# Patient Record
Sex: Male | Born: 1960
Health system: Southern US, Community
[De-identification: ages and names within clinical notes are randomized; demographics above are authoritative.]

## PROBLEM LIST (undated history)

## (undated) DIAGNOSIS — I10 Essential (primary) hypertension: Secondary | ICD-10-CM

## (undated) DIAGNOSIS — N529 Male erectile dysfunction, unspecified: Secondary | ICD-10-CM

## (undated) DIAGNOSIS — M1990 Unspecified osteoarthritis, unspecified site: Secondary | ICD-10-CM

## (undated) HISTORY — DX: Essential (primary) hypertension: I10

## (undated) HISTORY — DX: Male erectile dysfunction, unspecified: N52.9

---

## 2006-01-29 ENCOUNTER — Ambulatory Visit: Payer: Self-pay | Admitting: Unknown Physician Specialty

## 2009-04-27 HISTORY — PX: COLONOSCOPY: SHX174

## 2014-10-31 ENCOUNTER — Ambulatory Visit: Payer: Self-pay | Admitting: Family Medicine

## 2014-11-01 ENCOUNTER — Ambulatory Visit (INDEPENDENT_AMBULATORY_CARE_PROVIDER_SITE_OTHER): Payer: BLUE CROSS/BLUE SHIELD | Admitting: Family Medicine

## 2014-11-01 ENCOUNTER — Encounter: Payer: Self-pay | Admitting: Family Medicine

## 2014-11-01 VITALS — BP 120/90 | HR 60 | Ht 69.0 in | Wt 225.0 lb

## 2014-11-01 DIAGNOSIS — N529 Male erectile dysfunction, unspecified: Secondary | ICD-10-CM | POA: Diagnosis not present

## 2014-11-01 DIAGNOSIS — Z008 Encounter for other general examination: Secondary | ICD-10-CM

## 2014-11-01 DIAGNOSIS — I10 Essential (primary) hypertension: Secondary | ICD-10-CM | POA: Diagnosis not present

## 2014-11-01 DIAGNOSIS — E785 Hyperlipidemia, unspecified: Secondary | ICD-10-CM | POA: Diagnosis not present

## 2014-11-01 LAB — HEMOCCULT GUIAC POC 1CARD (OFFICE): FECAL OCCULT BLD: NEGATIVE

## 2014-11-01 MED ORDER — OMEGA-3-ACID ETHYL ESTERS 1 G PO CAPS: 1.0000 g | ORAL_CAPSULE | Freq: Every day | ORAL | Status: DC

## 2014-11-01 MED ORDER — QUINAPRIL HCL 20 MG PO TABS: 20.0000 mg | ORAL_TABLET | Freq: Every day | ORAL | Status: DC

## 2014-11-01 MED ORDER — SILDENAFIL CITRATE 100 MG PO TABS: 50.0000 mg | ORAL_TABLET | Freq: Every day | ORAL | Status: DC | PRN

## 2014-11-01 NOTE — Progress Notes (Signed)
Name: Ryan Parks   MRN: [id number]    DOB: 12/19/1960   Date:11/01/2014       Progress Note  Subjective  Chief Complaint  Chief Complaint  Patient presents with  . Hypertension    Hypertension This is a chronic problem. The current episode started more than 1 year ago. The problem is controlled. Pertinent negatives include no anxiety, blurred vision, chest pain, headaches, malaise/fatigue, neck pain, orthopnea, palpitations, peripheral edema, PND, shortness of breath or sweats. There are no known risk factors for coronary artery disease. Past treatments include ACE inhibitors. The current treatment provides moderate improvement. There is no history of angina, kidney disease, CAD/MI, CVA, heart failure, left ventricular hypertrophy, PVD or retinopathy. There is no history of chronic renal disease.  Male GU Problem The patient's pertinent negatives include no genital injury, genital itching, pelvic pain, penile discharge, penile pain, priapism, scrotal swelling or testicular pain. This is a recurrent problem. The current episode started more than 1 year ago. The problem occurs intermittently. The problem has been unchanged. Pertinent negatives include no abdominal pain, anorexia, chest pain, chills, constipation, coughing, diarrhea, discolored urine, dysuria, fever, flank pain, frequency, headaches, hematuria, hesitancy, joint pain, joint swelling, nausea, painful intercourse, rash, shortness of breath, sore throat, urgency, urinary retention or vomiting. Nothing aggravates the symptoms.    No problem-specific assessment & plan notes found for this encounter.   Past Medical History  Diagnosis Date  . Hypertension     Past Surgical History  Procedure Laterality Date  . Colonoscopy  2011    [facility]- polyps- repeat in 7    History reviewed. No pertinent family history.  History   Social History  . Marital Status: Married    Spouse Name: N/A  . Number of Children: N/A  .  Years of Education: N/A   Occupational History  . Not on file.   Social History Main Topics  . Smoking status: Never Smoker   . Smokeless tobacco: Not on file  . Alcohol Use: 0.0 oz/week    0 Standard drinks or equivalent per week  . Drug Use: No  . Sexual Activity: Yes   Other Topics Concern  . Not on file   Social History Narrative  . No narrative on file    No Known Allergies   Review of Systems  Constitutional: Negative for fever, chills, weight loss and malaise/fatigue.  HENT: Negative for ear discharge, ear pain and sore throat.   Eyes: Negative for blurred vision.  Respiratory: Negative for cough, sputum production, shortness of breath and wheezing.   Cardiovascular: Negative for chest pain, palpitations, orthopnea, leg swelling and PND.  Gastrointestinal: Negative for heartburn, nausea, vomiting, abdominal pain, diarrhea, constipation, blood in stool, melena and anorexia.  Genitourinary: Negative for dysuria, hesitancy, urgency, frequency, hematuria, flank pain, discharge, scrotal swelling, penile pain, testicular pain and pelvic pain.  Musculoskeletal: Negative for myalgias, back pain, joint pain and neck pain.  Skin: Negative for rash.  Neurological: Negative for dizziness, tingling, sensory change, focal weakness and headaches.  Endo/Heme/Allergies: Negative for environmental allergies and polydipsia. Does not bruise/bleed easily.  Psychiatric/Behavioral: Negative for depression and suicidal ideas. The patient is not nervous/anxious and does not have insomnia.      Objective  Filed Vitals:   11/01/14 0910  BP: 120/90  Pulse: 60  Height: 5\' 9"  (1.753 m)  Weight: 225 lb (102.059 kg)    Physical Exam  Constitutional: He is oriented to person, place, and time and well-developed, well-nourished, and in  no distress.  HENT:  Head: Normocephalic.  Right Ear: External ear normal.  Left Ear: External ear normal.  Nose: Nose normal.  Mouth/Throat: Oropharynx is  clear and moist.  Eyes: Conjunctivae and EOM are normal. Pupils are equal, round, and reactive to light. Right eye exhibits no discharge. Left eye exhibits no discharge. No scleral icterus.  Neck: Normal range of motion. Neck supple. No JVD present. No tracheal deviation present. No thyromegaly present.  Cardiovascular: Normal rate, regular rhythm, normal heart sounds and intact distal pulses.  Exam reveals no gallop and no friction rub.   No murmur heard. Pulmonary/Chest: Breath sounds normal. No respiratory distress. He has no wheezes. He has no rales.  Abdominal: Soft. Bowel sounds are normal. He exhibits no mass. There is no hepatosplenomegaly. There is no tenderness. There is no rebound, no guarding and no CVA tenderness.  Genitourinary: Rectum normal, prostate normal and penis normal. Guaiac negative stool. No discharge found.  Musculoskeletal: Normal range of motion. He exhibits no edema or tenderness.  Lymphadenopathy:    He has no cervical adenopathy.  Neurological: He is alert and oriented to person, place, and time. He has normal sensation, normal strength, normal reflexes and intact cranial nerves. No cranial nerve deficit.  Skin: Skin is warm. No rash noted.  Psychiatric: Mood and affect normal.      Assessment & Plan  Problem List Items Addressed This Visit      Cardiovascular and Mediastinum   Hypertension - Primary   Relevant Medications   quinapril (ACCUPRIL) 20 MG tablet   omega-3 acid ethyl esters (LOVAZA) 1 G capsule   sildenafil (VIAGRA) 100 MG tablet   Other Relevant Orders   Renal Function Panel   POCT Occult Blood Stool   POCT Urinalysis Dipstick    Other Visit Diagnoses    Hyperlipidemia        Relevant Medications    quinapril (ACCUPRIL) 20 MG tablet    omega-3 acid ethyl esters (LOVAZA) 1 G capsule    sildenafil (VIAGRA) 100 MG tablet    Other Relevant Orders    Lipid Profile    Erectile dysfunction, unspecified erectile dysfunction type         Relevant Medications    sildenafil (VIAGRA) 100 MG tablet    Other Relevant Orders    Lipid Profile    PSA    Rectal exam        Relevant Orders    Ambulatory referral to Gastroenterology         Dr. Otilio Miu [facility]  11/01/2014

## 2014-11-02 LAB — RENAL FUNCTION PANEL
Albumin: 5 g/dL (ref 3.5–5.5)
BUN/Creatinine Ratio: 10 (ref 9–20)
BUN: 9 mg/dL (ref 6–24)
CALCIUM: 9.5 mg/dL (ref 8.7–10.2)
CO2: 28 mmol/L (ref 18–29)
CREATININE: 0.88 mg/dL (ref 0.76–1.27)
Chloride: 96 mmol/L — ABNORMAL LOW (ref 97–108)
GFR calc Af Amer: 112 mL/min/{1.73_m2} (ref >59)
GFR calc non Af Amer: 97 mL/min/{1.73_m2} (ref >59)
GLUCOSE: 77 mg/dL (ref 65–99)
PHOSPHORUS: 3.3 mg/dL (ref 2.5–4.5)
POTASSIUM: 3.4 mmol/L — AB (ref 3.5–5.2)
Sodium: 142 mmol/L (ref 134–144)

## 2014-11-02 LAB — LIPID PANEL
CHOL/HDL RATIO: 4.2 ratio (ref 0.0–5.0)
Cholesterol, Total: 271 mg/dL — ABNORMAL HIGH (ref 100–199)
HDL: 64 mg/dL (ref >39)
LDL Calculated: 179 mg/dL — ABNORMAL HIGH (ref 0–99)
Triglycerides: 138 mg/dL (ref 0–149)
VLDL Cholesterol Cal: 28 mg/dL (ref 5–40)

## 2014-11-02 LAB — PSA: PROSTATE SPECIFIC AG, SERUM: 1.2 ng/mL (ref 0.0–4.0)

## 2014-12-04 ENCOUNTER — Ambulatory Visit
Admission: RE | Admit: 2014-12-04 | Discharge: 2014-12-04 | Disposition: A | Discharge status: Home / Self Care | Payer: BLUE CROSS/BLUE SHIELD | Source: Ambulatory Visit | Attending: Family Medicine | Admitting: Family Medicine

## 2014-12-04 ENCOUNTER — Encounter: Payer: Self-pay | Admitting: Family Medicine

## 2014-12-04 ENCOUNTER — Ambulatory Visit (INDEPENDENT_AMBULATORY_CARE_PROVIDER_SITE_OTHER): Payer: BLUE CROSS/BLUE SHIELD | Admitting: Family Medicine

## 2014-12-04 VITALS — BP 130/80 | HR 72 | Ht 63.0 in | Wt 225.0 lb

## 2014-12-04 DIAGNOSIS — S63602A Unspecified sprain of left thumb, initial encounter: Secondary | ICD-10-CM | POA: Insufficient documentation

## 2014-12-04 DIAGNOSIS — X58XXXA Exposure to other specified factors, initial encounter: Secondary | ICD-10-CM | POA: Insufficient documentation

## 2014-12-04 MED ORDER — TRAMADOL HCL 50 MG PO TABS
50.0000 mg | ORAL_TABLET | Freq: Three times a day (TID) | ORAL | Status: DC | PRN
Start: 2014-12-04 — End: 2015-05-29

## 2014-12-04 MED ORDER — ETODOLAC 500 MG PO TABS: 500.0000 mg | ORAL_TABLET | Freq: Two times a day (BID) | ORAL | Status: DC

## 2014-12-04 NOTE — Addendum Note (Signed)
Addended by: Juline Patch on: 12/04/2014 12:14 PM   Modules accepted: Orders

## 2014-12-04 NOTE — Progress Notes (Signed)
Name: Ryan Parks   MRN: [id number]    DOB: [birth year]   Date:12/04/2014       Progress Note  Subjective  Chief Complaint  Chief Complaint  Patient presents with  . Hand Pain    building a patio and smushed hand between a wall and machine    Hand Pain  The incident occurred more than 1 week ago. The incident occurred at work. The pain is present in the left hand. The quality of the pain is described as aching. The pain is at a severity of 6/10. The pain is moderate. The pain has been constant since the incident. Pertinent negatives include no chest pain, muscle weakness, numbness or tingling. The symptoms are aggravated by movement. He has tried acetaminophen for the symptoms. The treatment provided no relief.    No problem-specific assessment & plan notes found for this encounter.   Past Medical History  Diagnosis Date  . Hypertension   . Erectile dysfunction     Past Surgical History  Procedure Laterality Date  . Colonoscopy  2011    [facility] polyps- repeat in 7    History reviewed. No pertinent family history.  History   Social History  . Marital Status: Married    Spouse Name: N/A  . Number of Children: N/A  . Years of Education: N/A   Occupational History  . Not on file.   Social History Main Topics  . Smoking status: Never Smoker   . Smokeless tobacco: Not on file  . Alcohol Use: 0.0 oz/week    0 Standard drinks or equivalent per week  . Drug Use: No  . Sexual Activity: Yes   Other Topics Concern  . Not on file   Social History Narrative    No Known Allergies   Review of Systems  Constitutional: Negative for fever, chills, weight loss and malaise/fatigue.  HENT: Negative for ear discharge, ear pain and sore throat.   Eyes: Negative for blurred vision.  Respiratory: Negative for cough, sputum production, shortness of breath and wheezing.   Cardiovascular: Negative for chest pain, palpitations and leg swelling.  Gastrointestinal:  Negative for heartburn, nausea, abdominal pain, diarrhea, constipation, blood in stool and melena.  Genitourinary: Negative for dysuria, urgency, frequency and hematuria.  Musculoskeletal: Positive for joint pain. Negative for myalgias, back pain and neck pain.  Skin: Negative for rash.  Neurological: Negative for dizziness, tingling, sensory change, focal weakness, numbness and headaches.  Endo/Heme/Allergies: Negative for environmental allergies and polydipsia. Does not bruise/bleed easily.  Psychiatric/Behavioral: Negative for depression and suicidal ideas. The patient is not nervous/anxious and does not have insomnia.      Objective  Filed Vitals:   12/04/14 1132  BP: 130/80  Pulse: 72  Height: 5\' 3"  (1.6 m)  Weight: 225 lb (102.059 kg)    Physical Exam  Constitutional: He is oriented to person, place, and time and well-developed, well-nourished, and in no distress.  HENT:  Head: Normocephalic.  Right Ear: External ear normal.  Left Ear: External ear normal.  Nose: Nose normal.  Mouth/Throat: Oropharynx is clear and moist.  Eyes: Conjunctivae and EOM are normal. Pupils are equal, round, and reactive to light. Right eye exhibits no discharge. Left eye exhibits no discharge. No scleral icterus.  Neck: Normal range of motion. Neck supple. No JVD present. No tracheal deviation present. No thyromegaly present.  Cardiovascular: Normal rate, regular rhythm, normal heart sounds and intact distal pulses.  Exam reveals no gallop and no friction rub.  No murmur heard. Pulmonary/Chest: Breath sounds normal. No respiratory distress. He has no wheezes. He has no rales.  Abdominal: Soft. Bowel sounds are normal. He exhibits no mass. There is no hepatosplenomegaly. There is no tenderness. There is no rebound, no guarding and no CVA tenderness.  Musculoskeletal: Normal range of motion. He exhibits tenderness. He exhibits no edema.  Proximal left thumb  Lymphadenopathy:    He has no cervical  adenopathy.  Neurological: He is alert and oriented to person, place, and time. He has normal sensation, normal strength, normal reflexes and intact cranial nerves. No cranial nerve deficit.  Skin: Skin is warm. No rash noted.  Psychiatric: Mood and affect normal.      Assessment & Plan  Problem List Items Addressed This Visit    None    Visit Diagnoses    Sprain of thumb, left, initial encounter    -  Primary         Dr. Otilio Miu [facility]  12/04/2014

## 2014-12-05 ENCOUNTER — Other Ambulatory Visit: Payer: Self-pay

## 2014-12-05 DIAGNOSIS — S62102D Fracture of unspecified carpal bone, left wrist, subsequent encounter for fracture with routine healing: Secondary | ICD-10-CM

## 2015-02-04 ENCOUNTER — Telehealth: Payer: Self-pay | Admitting: Gastroenterology

## 2015-02-04 NOTE — Telephone Encounter (Signed)
Colonoscopy triage °

## 2015-02-12 ENCOUNTER — Other Ambulatory Visit: Payer: Self-pay

## 2015-02-12 ENCOUNTER — Telehealth: Payer: Self-pay

## 2015-02-12 NOTE — Telephone Encounter (Signed)
Gastroenterology Pre-Procedure Review  Request Date: 05/10/15 Requesting Physician: Dr. Otilio Miu  PATIENT REVIEW QUESTIONS: The patient responded to the following health history questions as indicated:    1. Are you having any GI issues? no 2. Do you have a personal history of Polyps? yes (but all benign) 3. Do you have a family history of Colon Cancer or Polyps? no 4. Diabetes Mellitus? no 5. Joint replacements in the past 12 months?no 6. Major health problems in the past 3 months?no 7. Any artificial heart valves, MVP, or defibrillator?no    MEDICATIONS & ALLERGIES:    Patient reports the following regarding taking any anticoagulation/antiplatelet therapy:   Plavix, Coumadin, Eliquis, Xarelto, Lovenox, Pradaxa, Brilinta, or Effient? no Aspirin? yes (ASA 81mg )  Patient confirms/reports the following medications:  Current Outpatient Prescriptions  Medication Sig Dispense Refill  . etodolac (LODINE) 500 MG tablet Take 1 tablet (500 mg total) by mouth 2 (two) times daily. 60 tablet 1  . omega-3 acid ethyl esters (LOVAZA) 1 G capsule Take 1 capsule (1 g total) by mouth daily. 100 capsule 4  . quinapril (ACCUPRIL) 20 MG tablet Take 1 tablet (20 mg total) by mouth daily. 90 tablet 2  . sildenafil (VIAGRA) 100 MG tablet Take 0.5-1 tablets (50-100 mg total) by mouth daily as needed for erectile dysfunction. 5 tablet 11  . traMADol (ULTRAM) 50 MG tablet Take 1 tablet (50 mg total) by mouth every 8 (eight) hours as needed. 30 tablet 0   No current facility-administered medications for this visit.    Patient confirms/reports the following allergies:  No Known Allergies  No orders of the defined types were placed in this encounter.    AUTHORIZATION INFORMATION Primary Insurance: 1D#: Group #:  Secondary Insurance: 1D#: Group #:  SCHEDULE INFORMATION: Date: [place] Time: Location: 05/10/15

## 2015-02-12 NOTE — Telephone Encounter (Signed)
Pt scheduled for a colonoscopy on 05/10/15. Instructs/rx mailed.

## 2015-04-24 ENCOUNTER — Other Ambulatory Visit: Payer: Self-pay

## 2015-04-24 DIAGNOSIS — N529 Male erectile dysfunction, unspecified: Secondary | ICD-10-CM

## 2015-04-24 MED ORDER — SILDENAFIL CITRATE 100 MG PO TABS: 50.0000 mg | ORAL_TABLET | Freq: Every day | ORAL | Status: DC | PRN

## 2015-05-02 ENCOUNTER — Encounter: Payer: Self-pay | Admitting: *Deleted

## 2015-05-20 ENCOUNTER — Other Ambulatory Visit: Payer: Self-pay

## 2015-05-29 ENCOUNTER — Encounter: Payer: Self-pay | Admitting: *Deleted

## 2015-05-30 NOTE — Discharge Instructions (Signed)

## 2015-05-31 ENCOUNTER — Ambulatory Visit: Payer: BLUE CROSS/BLUE SHIELD | Admitting: Anesthesiology

## 2015-05-31 ENCOUNTER — Other Ambulatory Visit: Payer: Self-pay | Admitting: Gastroenterology

## 2015-05-31 ENCOUNTER — Ambulatory Visit
Admission: RE | Admit: 2015-05-31 | Discharge: 2015-05-31 | Disposition: A | Discharge status: Home / Self Care | Payer: BLUE CROSS/BLUE SHIELD | Source: Ambulatory Visit | Attending: Gastroenterology | Admitting: Gastroenterology

## 2015-05-31 ENCOUNTER — Encounter
Admission: RE | Disposition: A | Discharge status: Home / Self Care | Payer: BLUE CROSS/BLUE SHIELD | Source: Ambulatory Visit | Attending: Gastroenterology

## 2015-05-31 DIAGNOSIS — Z860100 Personal history of colon polyps, unspecified: Secondary | ICD-10-CM | POA: Insufficient documentation

## 2015-05-31 DIAGNOSIS — D122 Benign neoplasm of ascending colon: Secondary | ICD-10-CM | POA: Insufficient documentation

## 2015-05-31 DIAGNOSIS — D125 Benign neoplasm of sigmoid colon: Secondary | ICD-10-CM | POA: Insufficient documentation

## 2015-05-31 DIAGNOSIS — I10 Essential (primary) hypertension: Secondary | ICD-10-CM | POA: Insufficient documentation

## 2015-05-31 DIAGNOSIS — Z7982 Long term (current) use of aspirin: Secondary | ICD-10-CM | POA: Insufficient documentation

## 2015-05-31 DIAGNOSIS — Z79899 Other long term (current) drug therapy: Secondary | ICD-10-CM | POA: Diagnosis not present

## 2015-05-31 DIAGNOSIS — Z1211 Encounter for screening for malignant neoplasm of colon: Secondary | ICD-10-CM | POA: Insufficient documentation

## 2015-05-31 DIAGNOSIS — D124 Benign neoplasm of descending colon: Secondary | ICD-10-CM | POA: Diagnosis not present

## 2015-05-31 DIAGNOSIS — K641 Second degree hemorrhoids: Secondary | ICD-10-CM | POA: Insufficient documentation

## 2015-05-31 HISTORY — PX: POLYPECTOMY: SHX5525

## 2015-05-31 HISTORY — PX: COLONOSCOPY WITH PROPOFOL: SHX5780

## 2015-05-31 SURGERY — COLONOSCOPY WITH PROPOFOL
Anesthesia: Monitor Anesthesia Care | Wound class: Contaminated

## 2015-05-31 MED ORDER — SIMETHICONE 40 MG/0.6ML PO SUSP
ORAL | Status: DC | PRN
  Administered 2015-05-31: 09:00:00

## 2015-05-31 MED ORDER — LIDOCAINE HCL (CARDIAC) 20 MG/ML IV SOLN
INTRAVENOUS | Status: DC | PRN
  Administered 2015-05-31: 50 mg via INTRAVENOUS

## 2015-05-31 MED ORDER — LACTATED RINGERS IV SOLN
INTRAVENOUS | Status: DC
  Administered 2015-05-31 (×2): via INTRAVENOUS

## 2015-05-31 MED ORDER — PROPOFOL 10 MG/ML IV BOLUS
INTRAVENOUS | Status: DC | PRN
  Administered 2015-05-31 (×4): 20 mg via INTRAVENOUS
  Administered 2015-05-31: 80 mg via INTRAVENOUS
  Administered 2015-05-31 (×8): 20 mg via INTRAVENOUS

## 2015-05-31 SURGICAL SUPPLY — 28 items
CANISTER SUCT 1200ML W/VALVE (MISCELLANEOUS) ×3 IMPLANT
FCP ESCP3.2XJMB 240X2.8X (MISCELLANEOUS)
FORCEPS BIOP RAD 4 LRG CAP 4 (CUTTING FORCEPS) IMPLANT
FORCEPS BIOP RJ4 240 W/NDL (MISCELLANEOUS)
FORCEPS ESCP3.2XJMB 240X2.8X (MISCELLANEOUS) IMPLANT
GOWN CVR UNV OPN BCK APRN NK (MISCELLANEOUS) ×4 IMPLANT
GOWN ISOL THUMB LOOP REG UNIV (MISCELLANEOUS) ×2
HEMOCLIP INSTINCT (CLIP) IMPLANT
INJECTOR VARIJECT VIN23 (MISCELLANEOUS) IMPLANT
KIT CO2 TUBING (TUBING) IMPLANT
KIT DEFENDO VALVE AND CONN (KITS) IMPLANT
KIT ENDO PROCEDURE OLY (KITS) ×3 IMPLANT
LIGATOR MULTIBAND 6SHOOTER MBL (MISCELLANEOUS) IMPLANT
MARKER SPOT ENDO TATTOO 5ML (MISCELLANEOUS) IMPLANT
PAD GROUND ADULT SPLIT (MISCELLANEOUS) ×3 IMPLANT
SNARE SHORT THROW 13M SML OVAL (MISCELLANEOUS) ×3 IMPLANT
SNARE SHORT THROW 30M LRG OVAL (MISCELLANEOUS) IMPLANT
SPOT EX ENDOSCOPIC TATTOO (MISCELLANEOUS)
SUCTION POLY TRAP 4CHAMBER (MISCELLANEOUS) IMPLANT
TRAP SUCTION POLY (MISCELLANEOUS) ×3 IMPLANT
TUBING CONN 6MMX3.1M (TUBING)
TUBING SUCTION CONN 0.25 STRL (TUBING) IMPLANT
UNDERPAD 30X60 958B10 (PK) (MISCELLANEOUS) IMPLANT
VALVE BIOPSY ENDO (VALVE) IMPLANT
VARIJECT INJECTOR VIN23 (MISCELLANEOUS)
WATER AUXILLARY (MISCELLANEOUS) IMPLANT
WATER STERILE IRR 250ML POUR (IV SOLUTION) ×3 IMPLANT
WATER STERILE IRR 500ML POUR (IV SOLUTION) IMPLANT

## 2015-05-31 NOTE — H&P (Signed)
  [facility]  [address] [place], [place] [address] Phone: [phone] Fax : [phone]  Primary Care Physician:  Otilio Miu, MD Primary Gastroenterologist:  Dr. [name]  Pre-Procedure History & Physical: HPI:  Ryan Parks is a 54 y.o. male is here for an colonoscopy.   Past Medical History  Diagnosis Date  . Hypertension   . Erectile dysfunction     Past Surgical History  Procedure Laterality Date  . Colonoscopy  2011    [facility]- polyps- repeat in 7    Prior to Admission medications   Medication Sig Start Date End Date Taking? Authorizing Provider  quinapril-hydrochlorothiazide (ACCURETIC) 10-12.5 MG tablet Take 1 tablet by mouth daily. AM   Yes Historical Provider, MD  aspirin 81 MG tablet Take 81 mg by mouth daily.    Historical Provider, MD  sildenafil (VIAGRA) 100 MG tablet Take 0.5-1 tablets (50-100 mg total) by mouth daily as needed for erectile dysfunction. 04/24/15   Juline Patch, MD    Allergies as of 02/12/2015  . (No Known Allergies)    History reviewed. No pertinent family history.  Social History   Social History  . Marital Status: Married    Spouse Name: N/A  . Number of Children: N/A  . Years of Education: N/A   Occupational History  . Not on file.   Social History Main Topics  . Smoking status: Never Smoker   . Smokeless tobacco: Not on file  . Alcohol Use: 0.0 oz/week    0 Standard drinks or equivalent per week     Comment: 1 drink/month  . Drug Use: No  . Sexual Activity: Yes   Other Topics Concern  . Not on file   Social History Narrative    Review of Systems: See HPI, otherwise negative ROS  Physical Exam: Ht 5' 9.5" (1.765 m)  Wt 215 lb (97.523 kg)  BMI 31.31 kg/m2 General:   Alert,  pleasant and cooperative in NAD Head:  Normocephalic and atraumatic. Neck:  Supple; no masses or thyromegaly. Lungs:  Clear throughout to auscultation.    Heart:  Regular rate and rhythm. Abdomen:   Soft, nontender and nondistended. Normal bowel sounds, without guarding, and without rebound.   Neurologic:  Alert and  oriented x4;  grossly normal neurologically.  Impression/Plan: Ryan Parks is here for an colonoscopy to be performed for history of colon polyps  Risks, benefits, limitations, and alternatives regarding  colonoscopy have been reviewed with the patient.  Questions have been answered.  All parties agreeable.   Ollen Bowl, MD  05/31/2015, 7:49 AM

## 2015-05-31 NOTE — Anesthesia Preprocedure Evaluation (Signed)
Anesthesia Evaluation  Patient identified by MRN, date of birth, ID band Patient awake    Reviewed: Allergy & Precautions, NPO status , Patient's Chart, lab work & pertinent test results, reviewed documented beta blocker date and time   Airway Mallampati: I  TM Distance: >3 FB Neck ROM: Full    Dental no notable dental hx.    Pulmonary neg pulmonary ROS,    Pulmonary exam normal        Cardiovascular hypertension, Pt. on medications Normal cardiovascular exam     Neuro/Psych negative neurological ROS     GI/Hepatic negative GI ROS, Neg liver ROS,   Endo/Other  negative endocrine ROS  Renal/GU negative Renal ROS     Musculoskeletal negative musculoskeletal ROS (+)   Abdominal   Peds  Hematology negative hematology ROS (+)   Anesthesia Other Findings   Reproductive/Obstetrics                             Anesthesia Physical Anesthesia Plan  ASA: II  Anesthesia Plan: MAC   Post-op Pain Management:    Induction: Intravenous  Airway Management Planned:   Additional Equipment:   Intra-op Plan:   Post-operative Plan:   Informed Consent: I have reviewed the patients History and Physical, chart, labs and discussed the procedure including the risks, benefits and alternatives for the proposed anesthesia with the patient or authorized representative who has indicated his/her understanding and acceptance.     Plan Discussed with: CRNA  Anesthesia Plan Comments:         Anesthesia Quick Evaluation

## 2015-05-31 NOTE — Transfer of Care (Signed)
Immediate Anesthesia Transfer of Care Note  Patient: Ryan Parks  Procedure(s) Performed: Procedure(s): COLONOSCOPY WITH PROPOFOL (N/A) POLYPECTOMY  Patient Location: PACU  Anesthesia Type: MAC  Level of Consciousness: awake, alert  and patient cooperative  Airway and Oxygen Therapy: Patient Spontanous Breathing and Patient connected to supplemental oxygen  Post-op Assessment: Post-op Vital signs reviewed, Patient's Cardiovascular Status Stable, Respiratory Function Stable, Patent Airway and No signs of Nausea or vomiting  Post-op Vital Signs: Reviewed and stable  Complications: No apparent anesthesia complications

## 2015-05-31 NOTE — Anesthesia Postprocedure Evaluation (Signed)
Anesthesia Post Note  Patient: Ryan Parks  Procedure(s) Performed: Procedure(s) (LRB): COLONOSCOPY WITH PROPOFOL (N/A) POLYPECTOMY  Patient location during evaluation: PACU Anesthesia Type: MAC Level of consciousness: awake and alert and oriented Pain management: pain level controlled Vital Signs Assessment: post-procedure vital signs reviewed and stable Respiratory status: spontaneous breathing and nonlabored ventilation Cardiovascular status: stable Postop Assessment: no signs of nausea or vomiting and adequate PO intake Anesthetic complications: no    Estill Batten

## 2015-05-31 NOTE — Op Note (Signed)
[facility] Gastroenterology Patient Name: Ryan Parks Procedure Date: 05/31/2015 8:18 AM MRN: [id number] Account #: [id number] Date of Birth: Mar 11, 1961 Admit Type: Outpatient Age: 55 Room: [facility] OR ROOM 01 Gender: Male Note Status: Finalized Procedure:         Colonoscopy Indications:       High risk colon cancer surveillance: Personal history of                     colonic polyps Providers:         Lucilla Lame, MD Referring MD:      Juline Patch, MD (Referring MD) Medicines:         Propofol per Anesthesia Complications:     No immediate complications. Procedure:         Pre-Anesthesia Assessment:                    - Prior to the procedure, a History and Physical was                     performed, and patient medications and allergies were                     reviewed. The patient's tolerance of previous anesthesia                     was also reviewed. The risks and benefits of the procedure                     and the sedation options and risks were discussed with the                     patient. All questions were answered, and informed consent                     was obtained. Prior Anticoagulants: The patient has taken                     no previous anticoagulant or antiplatelet agents. ASA                     Grade Assessment: II - A patient with mild systemic                     disease. After reviewing the risks and benefits, the                     patient was deemed in satisfactory condition to undergo                     the procedure.                    After obtaining informed consent, the colonoscope was                     passed under direct vision. Throughout the procedure, the                     patient's blood pressure, pulse, and oxygen saturations                     were monitored continuously. The [place]  colonoscope (S#: [id number]) was introduced through the anus                     and advanced to  the the cecum, identified by appendiceal                     orifice and ileocecal valve. The colonoscopy was performed                     without difficulty. The patient tolerated the procedure                     well. The quality of the bowel preparation was excellent. Findings:      The perianal and digital rectal examinations were normal.      A 5 mm polyp was found in the ascending colon. The polyp was sessile.       The polyp was removed with a cold biopsy forceps. Resection and       retrieval were complete.      A 10 mm polyp was found in the ascending colon. The polyp was sessile.       The polyp was removed with a hot snare. Resection and retrieval were       complete.      A 5 mm polyp was found in the descending colon. The polyp was sessile.       The polyp was removed with a cold biopsy forceps. Resection and       retrieval were complete.      Three sessile polyps were found in the sigmoid colon. The polyps were 3       to 4 mm in size. These polyps were removed with a cold biopsy forceps.       Resection and retrieval were complete.      A 7 mm polyp was found in the sigmoid colon. The polyp was sessile. The       polyp was removed with a cold snare. Resection and retrieval were       complete.      Non-bleeding internal hemorrhoids were found during retroflexion. The       hemorrhoids were Grade II (internal hemorrhoids that prolapse but reduce       spontaneously). Impression:        - One 5 mm polyp in the ascending colon. Resected and                     retrieved.                    - One 10 mm polyp in the ascending colon. Resected and                     retrieved.                    - One 5 mm polyp in the descending colon. Resected and                     retrieved.                    - Three 3 to 4 mm polyps in the sigmoid colon. Resected                     and retrieved.                    -  One 7 mm polyp in the sigmoid colon. Resected and                      retrieved.                    - Non-bleeding internal hemorrhoids. Recommendation:    - Await pathology results.                    - Repeat colonoscopy in 3 years for surveillance. Procedure Code(s): --- Professional ---                    [phone], Colonoscopy, flexible; with removal of tumor(s),                     polyp(s), or other lesion(s) by snare technique                    45380, 17, Colonoscopy, flexible; with biopsy, single or                     multiple Diagnosis Code(s): --- Professional ---                    Z86.010, Personal history of colonic polyps                    D12.2, Benign neoplasm of ascending colon                    D12.4, Benign neoplasm of descending colon                    D12.5, Benign neoplasm of sigmoid colon CPT copyright 2014 American Medical Association. All rights reserved. The codes documented in this report are preliminary and upon coder review may  be revised to meet current compliance requirements. Lucilla Lame, MD 05/31/2015 8:53:30 AM This report has been signed electronically. Number of Addenda: 0 Note Initiated On: 05/31/2015 8:18 AM Scope Withdrawal Time: 0 hours 11 minutes 34 seconds  Total Procedure Duration: 0 hours 13 minutes 37 seconds       [facility]

## 2015-05-31 NOTE — Anesthesia Procedure Notes (Signed)
Procedure Name: MAC Performed by: Loralee Weitzman Pre-anesthesia Checklist: Patient identified, Emergency Drugs available, Suction available, Patient being monitored and Timeout performed Patient Re-evaluated:Patient Re-evaluated prior to inductionOxygen Delivery Method: Nasal cannula       

## 2015-06-03 ENCOUNTER — Encounter: Payer: Self-pay | Admitting: Gastroenterology

## 2015-07-23 ENCOUNTER — Other Ambulatory Visit: Payer: Self-pay | Admitting: Family Medicine

## 2015-10-21 ENCOUNTER — Other Ambulatory Visit: Payer: Self-pay | Admitting: Family Medicine

## 2016-01-20 ENCOUNTER — Other Ambulatory Visit: Payer: Self-pay | Admitting: Family Medicine

## 2016-01-29 ENCOUNTER — Other Ambulatory Visit: Payer: Self-pay | Admitting: Internal Medicine

## 2016-01-30 ENCOUNTER — Ambulatory Visit: Payer: BLUE CROSS/BLUE SHIELD | Admitting: Internal Medicine

## 2016-01-30 ENCOUNTER — Ambulatory Visit: Payer: BLUE CROSS/BLUE SHIELD | Admitting: Family Medicine

## 2016-03-13 ENCOUNTER — Encounter: Payer: Self-pay | Admitting: Family Medicine

## 2016-03-13 ENCOUNTER — Ambulatory Visit (INDEPENDENT_AMBULATORY_CARE_PROVIDER_SITE_OTHER): Payer: BLUE CROSS/BLUE SHIELD | Admitting: Family Medicine

## 2016-03-13 VITALS — BP 134/80 | HR 80 | Ht 69.0 in | Wt 233.0 lb

## 2016-03-13 DIAGNOSIS — M20002 Unspecified deformity of left finger(s): Secondary | ICD-10-CM

## 2016-03-13 DIAGNOSIS — I10 Essential (primary) hypertension: Secondary | ICD-10-CM

## 2016-03-13 DIAGNOSIS — N5201 Erectile dysfunction due to arterial insufficiency: Secondary | ICD-10-CM | POA: Insufficient documentation

## 2016-03-13 DIAGNOSIS — N529 Male erectile dysfunction, unspecified: Secondary | ICD-10-CM | POA: Insufficient documentation

## 2016-03-13 DIAGNOSIS — E782 Mixed hyperlipidemia: Secondary | ICD-10-CM | POA: Diagnosis not present

## 2016-03-13 MED ORDER — SILDENAFIL CITRATE 100 MG PO TABS: 50.0000 mg | ORAL_TABLET | Freq: Every day | ORAL | 11 refills | Status: DC | PRN

## 2016-03-13 MED ORDER — ASPIRIN 81 MG PO TABS: 81.0000 mg | ORAL_TABLET | Freq: Every day | ORAL | 11 refills | Status: AC

## 2016-03-13 MED ORDER — QUINAPRIL HCL 20 MG PO TABS: ORAL_TABLET | ORAL | 1 refills | Status: DC

## 2016-03-13 NOTE — Progress Notes (Signed)
Name: Ryan Parks   MRN: [id number]    DOB: 1961-04-11   Date:03/13/2016       Progress Note  Subjective  Chief Complaint  Chief Complaint  Patient presents with  . Hypertension  . Erectile Dysfunction    Hypertension  This is a chronic problem. The current episode started more than 1 month ago. The problem has been gradually improving since onset. The problem is uncontrolled. Pertinent negatives include no anxiety, blurred vision, chest pain, headaches, malaise/fatigue, neck pain, orthopnea, palpitations, peripheral edema, PND, shortness of breath or sweats. There are no associated agents to hypertension. There are no known risk factors for coronary artery disease. Past treatments include ACE inhibitors. The current treatment provides mild improvement. There are no compliance problems.  There is no history of angina, kidney disease, CAD/MI, CVA, heart failure, left ventricular hypertrophy, PVD, renovascular disease or retinopathy. There is no history of chronic renal disease or a hypertension causing med.  Erectile Dysfunction  This is a chronic problem. The current episode started more than 1 year ago. The problem is unchanged. The nature of his difficulty is achieving erection and maintaining erection. He reports no anxiety. Irritative symptoms do not include frequency or urgency. Obstructive symptoms do not include dribbling, incomplete emptying, an intermittent stream, a slower stream, straining or a weak stream. Pertinent negatives include no chills, dysuria or hematuria. Nothing aggravates the symptoms. Past treatments include tadalafil. The treatment provided moderate relief. He has had no adverse reactions caused by medications. Risk factors include hypertension.    No problem-specific Assessment & Plan notes found for this encounter.   Past Medical History:  Diagnosis Date  . Erectile dysfunction   . Hypertension     Past Surgical History:  Procedure Laterality Date  .  COLONOSCOPY  2011   [facility]- polyps- repeat in 7  . COLONOSCOPY WITH PROPOFOL N/A 05/31/2015   Procedure: COLONOSCOPY WITH PROPOFOL;  Surgeon: Lucilla Lame, MD;  Location: [place];  Service: Endoscopy;  Laterality: N/A;  . POLYPECTOMY  05/31/2015   Procedure: POLYPECTOMY;  Surgeon: Lucilla Lame, MD;  Location: [place];  Service: Endoscopy;;    History reviewed. No pertinent family history.  Social History   Social History  . Marital status: Married    Spouse name: N/A  . Number of children: N/A  . Years of education: N/A   Occupational History  . Not on file.   Social History Main Topics  . Smoking status: Never Smoker  . Smokeless tobacco: Not on file  . Alcohol use 0.0 oz/week     Comment: 1 drink/month  . Drug use: No  . Sexual activity: Yes   Other Topics Concern  . Not on file   Social History Narrative  . No narrative on file    No Known Allergies   Review of Systems  Constitutional: Negative for chills, fever, malaise/fatigue and weight loss.  HENT: Negative for ear discharge, ear pain and sore throat.   Eyes: Negative for blurred vision.  Respiratory: Negative for cough, sputum production, shortness of breath and wheezing.   Cardiovascular: Negative for chest pain, palpitations, orthopnea, leg swelling and PND.  Gastrointestinal: Negative for abdominal pain, blood in stool, constipation, diarrhea, heartburn, melena and nausea.  Genitourinary: Negative for dysuria, frequency, hematuria, incomplete emptying and urgency.  Musculoskeletal: Negative for back pain, joint pain, myalgias and neck pain.  Skin: Negative for rash.  Neurological: Negative for dizziness, tingling, sensory change, focal weakness and headaches.  Endo/Heme/Allergies: Negative for  environmental allergies and polydipsia. Does not bruise/bleed easily.  Psychiatric/Behavioral: Negative for depression and suicidal ideas. The patient is not nervous/anxious and does not  have insomnia.      Objective  Vitals:   03/13/16 0911  BP: 134/80  Pulse: 80  Weight: 233 lb (105.7 kg)  Height: 5\' 9"  (1.753 m)    Physical Exam  Constitutional: He is oriented to person, place, and time and well-developed, well-nourished, and in no distress.  HENT:  Head: Normocephalic.  Right Ear: External ear normal.  Left Ear: External ear normal.  Nose: Nose normal.  Mouth/Throat: Oropharynx is clear and moist.  Eyes: Conjunctivae and EOM are normal. Pupils are equal, round, and reactive to light. Right eye exhibits no discharge. Left eye exhibits no discharge. No scleral icterus.  Neck: Normal range of motion. Neck supple. No JVD present. No tracheal deviation present. No thyromegaly present.  Cardiovascular: Normal rate, regular rhythm, normal heart sounds and intact distal pulses.  Exam reveals no gallop, no S3, no S4 and no friction rub.   No murmur heard. Pulmonary/Chest: Effort normal and breath sounds normal. No respiratory distress. He has no wheezes. He has no rales. Right breast exhibits no mass. Left breast exhibits no mass.  Abdominal: Soft. Normal aorta and bowel sounds are normal. He exhibits no mass. There is no hepatosplenomegaly. There is no tenderness. There is no rebound, no guarding and no CVA tenderness.  Genitourinary: Rectum normal, prostate normal, testes/scrotum normal and penis normal.  Musculoskeletal: Normal range of motion. He exhibits no edema.       Left hand: He exhibits tenderness, deformity and swelling.       Hands: Lymphadenopathy:    He has no cervical adenopathy.  Neurological: He is alert and oriented to person, place, and time. He has normal sensation, normal strength, normal reflexes and intact cranial nerves. No cranial nerve deficit.  Skin: Skin is warm and intact. No rash noted.  Psychiatric: Mood and affect normal.  Nursing note and vitals reviewed.     Assessment & Plan  Problem List Items Addressed This Visit       Cardiovascular and Mediastinum   Essential hypertension - Primary   Relevant Medications   sildenafil (VIAGRA) 100 MG tablet   aspirin 81 MG tablet   quinapril (ACCUPRIL) 20 MG tablet   Other Relevant Orders   Renal Function Panel   Erectile dysfunction due to arterial insufficiency   Relevant Medications   sildenafil (VIAGRA) 100 MG tablet   aspirin 81 MG tablet   quinapril (ACCUPRIL) 20 MG tablet   Other Relevant Orders   PSA     Genitourinary   Erectile dysfunction   Relevant Medications   sildenafil (VIAGRA) 100 MG tablet   Other Relevant Orders   PSA   PSA    Other Visit Diagnoses    Mixed hyperlipidemia       Relevant Medications   sildenafil (VIAGRA) 100 MG tablet   aspirin 81 MG tablet   quinapril (ACCUPRIL) 20 MG tablet   Other Relevant Orders   Lipid Profile   Finger deformity, left       Relevant Orders   Ambulatory referral to Orthopedic Surgery        Dr. Otilio Miu [facility]  03/13/16

## 2016-03-14 LAB — RENAL FUNCTION PANEL
ALBUMIN: 4.8 g/dL (ref 3.5–5.5)
BUN/Creatinine Ratio: 14 (ref 9–20)
BUN: 13 mg/dL (ref 6–24)
CALCIUM: 9.6 mg/dL (ref 8.7–10.2)
CHLORIDE: 99 mmol/L (ref 96–106)
CO2: 23 mmol/L (ref 18–29)
Creatinine, Ser: 0.92 mg/dL (ref 0.76–1.27)
GFR calc Af Amer: 107 mL/min/{1.73_m2} (ref >59)
GFR, EST NON AFRICAN AMERICAN: 93 mL/min/{1.73_m2} (ref >59)
GLUCOSE: 97 mg/dL (ref 65–99)
PHOSPHORUS: 3.2 mg/dL (ref 2.5–4.5)
POTASSIUM: 4.4 mmol/L (ref 3.5–5.2)
SODIUM: 142 mmol/L (ref 134–144)

## 2016-03-14 LAB — LIPID PANEL
CHOLESTEROL TOTAL: 259 mg/dL — AB (ref 100–199)
Chol/HDL Ratio: 4.7 ratio units (ref 0.0–5.0)
HDL: 55 mg/dL (ref >39)
LDL CALC: 184 mg/dL — AB (ref 0–99)
TRIGLYCERIDES: 102 mg/dL (ref 0–149)
VLDL Cholesterol Cal: 20 mg/dL (ref 5–40)

## 2016-03-14 LAB — PSA: Prostate Specific Ag, Serum: 1.1 ng/mL (ref 0.0–4.0)

## 2016-04-24 ENCOUNTER — Other Ambulatory Visit: Payer: Self-pay | Admitting: Family Medicine

## 2016-07-20 ENCOUNTER — Other Ambulatory Visit: Payer: Self-pay | Admitting: Family Medicine

## 2016-09-10 ENCOUNTER — Ambulatory Visit: Payer: BLUE CROSS/BLUE SHIELD | Admitting: Family Medicine

## 2016-10-07 ENCOUNTER — Encounter: Payer: Self-pay | Admitting: Family Medicine

## 2016-10-07 ENCOUNTER — Ambulatory Visit (INDEPENDENT_AMBULATORY_CARE_PROVIDER_SITE_OTHER): Payer: BLUE CROSS/BLUE SHIELD | Admitting: Family Medicine

## 2016-10-07 VITALS — BP 140/100 | HR 86 | Ht 69.0 in | Wt 234.0 lb

## 2016-10-07 DIAGNOSIS — I10 Essential (primary) hypertension: Secondary | ICD-10-CM

## 2016-10-07 DIAGNOSIS — J301 Allergic rhinitis due to pollen: Secondary | ICD-10-CM

## 2016-10-07 MED ORDER — HYDROCHLOROTHIAZIDE 12.5 MG PO TABS: 12.5000 mg | ORAL_TABLET | Freq: Every day | ORAL | 0 refills | Status: DC

## 2016-10-07 MED ORDER — QUINAPRIL HCL 20 MG PO TABS: ORAL_TABLET | ORAL | 1 refills | Status: DC

## 2016-10-07 MED ORDER — FLUTICASONE PROPIONATE 50 MCG/ACT NA SUSP
2.0000 | Freq: Every day | NASAL | 6 refills | Status: DC
Start: 2016-10-07 — End: 2017-05-31

## 2016-10-07 NOTE — Progress Notes (Signed)
Name: Ryan Parks   MRN: [id number]    DOB: 03-Nov-1960   Date:10/07/2016       Progress Note  Subjective  Chief Complaint  Chief Complaint  Patient presents with  . Hypertension    Hypertension  This is a chronic problem. The current episode started more than 1 year ago. The problem is unchanged. The problem is controlled. Pertinent negatives include no anxiety, blurred vision, chest pain, headaches, malaise/fatigue, neck pain, orthopnea, palpitations, peripheral edema, PND, shortness of breath or sweats. There are no associated agents to hypertension. Risk factors for coronary artery disease include obesity and male gender. Past treatments include ACE inhibitors. There are no compliance problems.  There is no history of angina, kidney disease, CAD/MI, CVA, heart failure, left ventricular hypertrophy, PVD or retinopathy. There is no history of chronic renal disease, a hypertension causing med or renovascular disease.    No problem-specific Assessment & Plan notes found for this encounter.   Past Medical History:  Diagnosis Date  . Erectile dysfunction   . Hypertension     Past Surgical History:  Procedure Laterality Date  . COLONOSCOPY  2011   [facility]- polyps- repeat in 7  . COLONOSCOPY WITH PROPOFOL N/A 05/31/2015   Procedure: COLONOSCOPY WITH PROPOFOL;  Surgeon: Lucilla Lame, MD;  Location: [place];  Service: Endoscopy;  Laterality: N/A;  . POLYPECTOMY  05/31/2015   Procedure: POLYPECTOMY;  Surgeon: Lucilla Lame, MD;  Location: [place];  Service: Endoscopy;;    No family history on file.  Social History   Social History  . Marital status: Married    Spouse name: N/A  . Number of children: N/A  . Years of education: N/A   Occupational History  . Not on file.   Social History Main Topics  . Smoking status: Never Smoker  . Smokeless tobacco: Never Used  . Alcohol use 0.0 oz/week     Comment: 1 drink/month  . Drug use: No  . Sexual  activity: Yes   Other Topics Concern  . Not on file   Social History Narrative  . No narrative on file    No Known Allergies  Outpatient Medications Prior to Visit  Medication Sig Dispense Refill  . aspirin 81 MG tablet Take 1 tablet (81 mg total) by mouth daily. 30 tablet 11  . quinapril (ACCUPRIL) 20 MG tablet TAKE 1 TABLET DAILY 90 tablet 1  . sildenafil (VIAGRA) 100 MG tablet Take 0.5-1 tablets (50-100 mg total) by mouth daily as needed for erectile dysfunction. (Patient not taking: Reported on 10/07/2016) 4 tablet 11  . quinapril (ACCUPRIL) 20 MG tablet TAKE 1 TABLET DAILY (NEED APPOINTMENT, SCHEDULE APPOINTMENT) 90 tablet 0  . quinapril (ACCUPRIL) 20 MG tablet TAKE 1 TABLET DAILY (NEED APPOINTMENT, SCHEDULE APPOINTMENT) 90 tablet 0   No facility-administered medications prior to visit.     Review of Systems  Constitutional: Negative for chills, fever, malaise/fatigue and weight loss.  HENT: Negative for ear discharge, ear pain and sore throat.   Eyes: Negative for blurred vision.  Respiratory: Negative for cough, sputum production, shortness of breath and wheezing.   Cardiovascular: Negative for chest pain, palpitations, orthopnea, leg swelling and PND.  Gastrointestinal: Negative for abdominal pain, blood in stool, constipation, diarrhea, heartburn, melena and nausea.  Genitourinary: Negative for dysuria, frequency, hematuria and urgency.  Musculoskeletal: Negative for back pain, joint pain, myalgias and neck pain.  Skin: Negative for rash.  Neurological: Negative for dizziness, tingling, sensory change, focal weakness and headaches.  Endo/Heme/Allergies: Negative for environmental allergies and polydipsia. Does not bruise/bleed easily.  Psychiatric/Behavioral: Negative for depression and suicidal ideas. The patient is not nervous/anxious and does not have insomnia.      Objective  Vitals:   10/07/16 0834  BP: (!) 140/100  Pulse: 86  Weight: 234 lb (106.1 kg)   Height: 5\' 9"  (1.753 m)    Physical Exam  Constitutional: He is oriented to person, place, and time and well-developed, well-nourished, and in no distress.  HENT:  Head: Normocephalic.  Right Ear: External ear normal.  Left Ear: External ear normal.  Nose: Nose normal.  Mouth/Throat: Oropharynx is clear and moist.  Eyes: Conjunctivae and EOM are normal. Pupils are equal, round, and reactive to light. Right eye exhibits no discharge. Left eye exhibits no discharge. No scleral icterus.  Neck: Normal range of motion. Neck supple. No JVD present. No tracheal deviation present. No thyromegaly present.  Cardiovascular: Normal rate, regular rhythm, normal heart sounds and intact distal pulses.  Exam reveals no gallop and no friction rub.   No murmur heard. Pulmonary/Chest: Breath sounds normal. No respiratory distress. He has no wheezes. He has no rales.  Abdominal: Soft. Bowel sounds are normal. He exhibits no mass. There is no hepatosplenomegaly. There is no tenderness. There is no rebound, no guarding and no CVA tenderness.  Musculoskeletal: Normal range of motion. He exhibits no edema or tenderness.  Lymphadenopathy:    He has no cervical adenopathy.  Neurological: He is alert and oriented to person, place, and time. He has normal sensation, normal strength, normal reflexes and intact cranial nerves. No cranial nerve deficit.  Skin: Skin is warm. No rash noted.  Psychiatric: Mood and affect normal.  Nursing note and vitals reviewed.     Assessment & Plan  Problem List Items Addressed This Visit      Cardiovascular and Mediastinum   Essential hypertension   Relevant Medications   hydrochlorothiazide (HYDRODIURIL) 12.5 MG tablet   quinapril (ACCUPRIL) 20 MG tablet   Other Relevant Orders   Renal function panel    Other Visit Diagnoses    Seasonal allergic rhinitis due to pollen    -  Primary   Relevant Medications   fluticasone (FLONASE) 50 MCG/ACT nasal spray      Meds  ordered this encounter  Medications  . DISCONTD: quinapril (ACCUPRIL) 20 MG tablet    Sig: TAKE 1 TABLET DAILY    Dispense:  90 tablet    Refill:  1    sched appt  . fluticasone (FLONASE) 50 MCG/ACT nasal spray    Sig: Place 2 sprays into both nostrils daily.    Dispense:  16 g    Refill:  6  . hydrochlorothiazide (HYDRODIURIL) 12.5 MG tablet    Sig: Take 1 tablet (12.5 mg total) by mouth daily.    Dispense:  30 tablet    Refill:  0  . quinapril (ACCUPRIL) 20 MG tablet    Sig: TAKE 1 TABLET DAILY    Dispense:  90 tablet    Refill:  1    sched appt      Dr. Otilio Miu [facility]  10/07/16

## 2016-10-08 LAB — RENAL FUNCTION PANEL
ALBUMIN: 4.5 g/dL (ref 3.5–5.5)
BUN/Creatinine Ratio: 10 (ref 9–20)
BUN: 10 mg/dL (ref 6–24)
CO2: 25 mmol/L (ref 20–29)
CREATININE: 0.98 mg/dL (ref 0.76–1.27)
Calcium: 9.4 mg/dL (ref 8.7–10.2)
Chloride: 102 mmol/L (ref 96–106)
GFR, EST AFRICAN AMERICAN: 99 mL/min/{1.73_m2} (ref >59)
GFR, EST NON AFRICAN AMERICAN: 85 mL/min/{1.73_m2} (ref >59)
GLUCOSE: 97 mg/dL (ref 65–99)
PHOSPHORUS: 3 mg/dL (ref 2.5–4.5)
POTASSIUM: 4.3 mmol/L (ref 3.5–5.2)
Sodium: 143 mmol/L (ref 134–144)

## 2016-10-28 ENCOUNTER — Other Ambulatory Visit: Payer: Self-pay | Admitting: Family Medicine

## 2016-10-28 DIAGNOSIS — I10 Essential (primary) hypertension: Secondary | ICD-10-CM

## 2016-11-03 ENCOUNTER — Other Ambulatory Visit: Payer: Self-pay | Admitting: Family Medicine

## 2016-11-05 ENCOUNTER — Ambulatory Visit (INDEPENDENT_AMBULATORY_CARE_PROVIDER_SITE_OTHER): Payer: BLUE CROSS/BLUE SHIELD | Admitting: Family Medicine

## 2016-11-05 ENCOUNTER — Encounter: Payer: Self-pay | Admitting: Family Medicine

## 2016-11-05 VITALS — BP 110/62 | HR 70 | Ht 69.0 in | Wt 230.0 lb

## 2016-11-05 DIAGNOSIS — Z23 Encounter for immunization: Secondary | ICD-10-CM | POA: Diagnosis not present

## 2016-11-05 DIAGNOSIS — N529 Male erectile dysfunction, unspecified: Secondary | ICD-10-CM

## 2016-11-05 DIAGNOSIS — E663 Overweight: Secondary | ICD-10-CM | POA: Diagnosis not present

## 2016-11-05 DIAGNOSIS — Z1159 Encounter for screening for other viral diseases: Secondary | ICD-10-CM

## 2016-11-05 DIAGNOSIS — I10 Essential (primary) hypertension: Secondary | ICD-10-CM

## 2016-11-05 MED ORDER — SILDENAFIL CITRATE 100 MG PO TABS: 50.0000 mg | ORAL_TABLET | Freq: Every day | ORAL | 11 refills | Status: DC | PRN

## 2016-11-05 MED ORDER — HYDROCHLOROTHIAZIDE 12.5 MG PO TABS: 12.5000 mg | ORAL_TABLET | Freq: Every day | ORAL | 1 refills | Status: DC

## 2016-11-05 MED ORDER — QUINAPRIL HCL 20 MG PO TABS: ORAL_TABLET | ORAL | 1 refills | Status: DC

## 2016-11-05 NOTE — Progress Notes (Signed)
Name: Ryan Parks   MRN: [id number]    DOB: [birth year]   Date:11/05/2016       Progress Note  Subjective  Chief Complaint  Chief Complaint  Patient presents with  . Hypertension    Hypertension  This is a new problem. The current episode started more than 1 year ago. The problem has been gradually worsening since onset. The problem is controlled. Pertinent negatives include no anxiety, blurred vision, chest pain, headaches, malaise/fatigue, neck pain, orthopnea, palpitations, peripheral edema, PND, shortness of breath or sweats. There are no associated agents to hypertension. There are no known risk factors for coronary artery disease. Past treatments include ACE inhibitors and diuretics. The current treatment provides mild improvement. There are no compliance problems.  There is no history of angina, kidney disease, CAD/MI, CVA, heart failure, left ventricular hypertrophy, PVD or retinopathy. There is no history of chronic renal disease, a hypertension causing med or renovascular disease.    No problem-specific Assessment & Plan notes found for this encounter.   Past Medical History:  Diagnosis Date  . Erectile dysfunction   . Hypertension     Past Surgical History:  Procedure Laterality Date  . COLONOSCOPY  2011   [facility]- polyps- repeat in 7  . COLONOSCOPY WITH PROPOFOL N/A 05/31/2015   Procedure: COLONOSCOPY WITH PROPOFOL;  Surgeon: Lucilla Lame, MD;  Location: [place];  Service: Endoscopy;  Laterality: N/A;  . POLYPECTOMY  05/31/2015   Procedure: POLYPECTOMY;  Surgeon: Lucilla Lame, MD;  Location: [place];  Service: Endoscopy;;    No family history on file.  Social History   Social History  . Marital status: Married    Spouse name: N/A  . Number of children: N/A  . Years of education: N/A   Occupational History  . Not on file.   Social History Main Topics  . Smoking status: Never Smoker  . Smokeless tobacco: Never Used  . Alcohol use  0.0 oz/week     Comment: 1 drink/month  . Drug use: No  . Sexual activity: Yes   Other Topics Concern  . Not on file   Social History Narrative  . No narrative on file    No Known Allergies  Outpatient Medications Prior to Visit  Medication Sig Dispense Refill  . aspirin 81 MG tablet Take 1 tablet (81 mg total) by mouth daily. 30 tablet 11  . fluticasone (FLONASE) 50 MCG/ACT nasal spray Place 2 sprays into both nostrils daily. 16 g 6  . hydrochlorothiazide (HYDRODIURIL) 12.5 MG tablet TAKE 1 TABLET BY MOUTH EVERY DAY 30 tablet 0  . quinapril (ACCUPRIL) 20 MG tablet TAKE 1 TABLET DAILY (NEED APPOINTMENT, SCHEDULE APPOINTMENT) 90 tablet 1  . sildenafil (VIAGRA) 100 MG tablet Take 0.5-1 tablets (50-100 mg total) by mouth daily as needed for erectile dysfunction. (Patient not taking: Reported on 11/05/2016) 4 tablet 11   No facility-administered medications prior to visit.     Review of Systems  Constitutional: Negative for chills, fever, malaise/fatigue and weight loss.  HENT: Negative for ear discharge, ear pain and sore throat.   Eyes: Negative for blurred vision.  Respiratory: Negative for cough, sputum production, shortness of breath and wheezing.   Cardiovascular: Negative for chest pain, palpitations, orthopnea, leg swelling and PND.  Gastrointestinal: Negative for abdominal pain, blood in stool, constipation, diarrhea, heartburn, melena and nausea.  Genitourinary: Negative for dysuria, frequency, hematuria and urgency.  Musculoskeletal: Negative for back pain, joint pain, myalgias and neck pain.  Skin: Negative  for rash.  Neurological: Negative for dizziness, tingling, sensory change, focal weakness and headaches.  Endo/Heme/Allergies: Negative for environmental allergies and polydipsia. Does not bruise/bleed easily.  Psychiatric/Behavioral: Negative for depression and suicidal ideas. The patient is not nervous/anxious and does not have insomnia.      Objective  Vitals:    11/05/16 0909  BP: 110/62  Pulse: 70  Weight: 230 lb (104.3 kg)  Height: 5\' 9"  (1.753 m)    Physical Exam  Constitutional: He is oriented to person, place, and time and well-developed, well-nourished, and in no distress.  HENT:  Head: Normocephalic.  Right Ear: External ear normal.  Left Ear: External ear normal.  Nose: Nose normal.  Mouth/Throat: Oropharynx is clear and moist.  Eyes: Pupils are equal, round, and reactive to light. Conjunctivae and EOM are normal. Right eye exhibits no discharge. Left eye exhibits no discharge. No scleral icterus.  Neck: Normal range of motion. Neck supple. No JVD present. No tracheal deviation present. No thyromegaly present.  Cardiovascular: Normal rate, regular rhythm, normal heart sounds and intact distal pulses.  Exam reveals no gallop and no friction rub.   No murmur heard. Pulmonary/Chest: Breath sounds normal. No respiratory distress. He has no wheezes. He has no rales.  Abdominal: Soft. Bowel sounds are normal. He exhibits no mass. There is no hepatosplenomegaly. There is no tenderness. There is no rebound, no guarding and no CVA tenderness.  Musculoskeletal: Normal range of motion. He exhibits no edema or tenderness.  Lymphadenopathy:    He has no cervical adenopathy.  Neurological: He is alert and oriented to person, place, and time. He has normal sensation, normal strength, normal reflexes and intact cranial nerves. No cranial nerve deficit.  Skin: Skin is warm. No rash noted.  Psychiatric: Mood and affect normal.  Nursing note and vitals reviewed.     Assessment & Plan  Problem List Items Addressed This Visit      Cardiovascular and Mediastinum   Essential hypertension - Primary   Relevant Medications   quinapril (ACCUPRIL) 20 MG tablet   sildenafil (VIAGRA) 100 MG tablet   hydrochlorothiazide (HYDRODIURIL) 12.5 MG tablet   Other Relevant Orders   Renal function panel   Lipid Profile     Genitourinary   Erectile  dysfunction   Relevant Medications   sildenafil (VIAGRA) 100 MG tablet    Other Visit Diagnoses    Overweight       Relevant Orders   Lipid Profile   Need for hepatitis C screening test       Relevant Orders   Hepatitis C antibody   Need for diphtheria-tetanus-pertussis (Tdap) vaccine       Relevant Orders   Tdap vaccine greater than or equal to 7yo IM (Completed)      Meds ordered this encounter  Medications  . quinapril (ACCUPRIL) 20 MG tablet    Sig: TAKE 1 TABLET DAILY (NEED APPOINTMENT, SCHEDULE APPOINTMENT)    Dispense:  90 tablet    Refill:  1  . sildenafil (VIAGRA) 100 MG tablet    Sig: Take 0.5-1 tablets (50-100 mg total) by mouth daily as needed for erectile dysfunction.    Dispense:  4 tablet    Refill:  11  . hydrochlorothiazide (HYDRODIURIL) 12.5 MG tablet    Sig: Take 1 tablet (12.5 mg total) by mouth daily.    Dispense:  90 tablet    Refill:  1      Dr. [facility]  11/05/16   

## 2016-11-06 LAB — RENAL FUNCTION PANEL
Albumin: 4.7 g/dL (ref 3.5–5.5)
BUN / CREAT RATIO: 12 (ref 9–20)
BUN: 12 mg/dL (ref 6–24)
CALCIUM: 9.6 mg/dL (ref 8.7–10.2)
CHLORIDE: 102 mmol/L (ref 96–106)
CO2: 26 mmol/L (ref 20–29)
CREATININE: 1 mg/dL (ref 0.76–1.27)
GFR calc Af Amer: 96 mL/min/{1.73_m2} (ref >59)
GFR calc non Af Amer: 83 mL/min/{1.73_m2} (ref >59)
GLUCOSE: 85 mg/dL (ref 65–99)
Phosphorus: 3.2 mg/dL (ref 2.5–4.5)
Potassium: 3.9 mmol/L (ref 3.5–5.2)
SODIUM: 144 mmol/L (ref 134–144)

## 2016-11-06 LAB — LIPID PANEL
CHOLESTEROL TOTAL: 263 mg/dL — AB (ref 100–199)
Chol/HDL Ratio: 5.2 ratio — ABNORMAL HIGH (ref 0.0–5.0)
HDL: 51 mg/dL (ref >39)
LDL CALC: 191 mg/dL — AB (ref 0–99)
Triglycerides: 106 mg/dL (ref 0–149)
VLDL Cholesterol Cal: 21 mg/dL (ref 5–40)

## 2016-11-06 LAB — HEPATITIS C ANTIBODY: Hep C Virus Ab: <0.1 s/co ratio (ref 0.0–0.9)

## 2017-04-27 ENCOUNTER — Other Ambulatory Visit: Payer: Self-pay | Admitting: Family Medicine

## 2017-04-27 DIAGNOSIS — I10 Essential (primary) hypertension: Secondary | ICD-10-CM

## 2017-05-10 ENCOUNTER — Ambulatory Visit: Payer: BLUE CROSS/BLUE SHIELD | Admitting: Family Medicine

## 2017-05-28 ENCOUNTER — Ambulatory Visit: Payer: BLUE CROSS/BLUE SHIELD | Admitting: Family Medicine

## 2017-05-31 ENCOUNTER — Ambulatory Visit: Payer: BLUE CROSS/BLUE SHIELD | Admitting: Family Medicine

## 2017-05-31 ENCOUNTER — Encounter: Payer: Self-pay | Admitting: Family Medicine

## 2017-05-31 VITALS — BP 120/60 | HR 80 | Ht 69.0 in | Wt 229.0 lb

## 2017-05-31 DIAGNOSIS — E7800 Pure hypercholesterolemia, unspecified: Secondary | ICD-10-CM

## 2017-05-31 DIAGNOSIS — I10 Essential (primary) hypertension: Secondary | ICD-10-CM

## 2017-05-31 DIAGNOSIS — N529 Male erectile dysfunction, unspecified: Secondary | ICD-10-CM

## 2017-05-31 DIAGNOSIS — J301 Allergic rhinitis due to pollen: Secondary | ICD-10-CM

## 2017-05-31 MED ORDER — SILDENAFIL CITRATE 100 MG PO TABS: 50.0000 mg | ORAL_TABLET | Freq: Every day | ORAL | 11 refills | Status: DC | PRN

## 2017-05-31 MED ORDER — QUINAPRIL HCL 20 MG PO TABS: ORAL_TABLET | ORAL | 1 refills | Status: DC

## 2017-05-31 MED ORDER — HYDROCHLOROTHIAZIDE 12.5 MG PO TABS: 12.5000 mg | ORAL_TABLET | Freq: Every day | ORAL | 1 refills | Status: DC

## 2017-05-31 MED ORDER — FLUTICASONE PROPIONATE 50 MCG/ACT NA SUSP
2.0000 | Freq: Every day | NASAL | 6 refills | Status: DC
Start: 2017-05-31 — End: 2019-04-27

## 2017-05-31 NOTE — Progress Notes (Signed)
Name: Ryan Parks   MRN: [id number]    DOB: [birth year]   Date:05/31/2017       Progress Note  Subjective  Chief Complaint  Chief Complaint  Patient presents with  . Hypertension  . Allergic Rhinitis     Hypertension  This is a chronic problem. The current episode started more than 1 year ago. The problem is unchanged. The problem is controlled. Pertinent negatives include no anxiety, blurred vision, chest pain, headaches, malaise/fatigue, neck pain, orthopnea, palpitations, peripheral edema, PND, shortness of breath or sweats. There are no associated agents to hypertension. Past treatments include ACE inhibitors and diuretics. The current treatment provides moderate improvement. There are no compliance problems.  There is no history of angina, kidney disease, CAD/MI, CVA, heart failure, left ventricular hypertrophy, PVD or retinopathy. There is no history of chronic renal disease, hyperaldosteronism or a hypertension causing med.  Hyperlipidemia  This is a chronic problem. The current episode started more than 1 year ago. The problem is uncontrolled. Recent lipid tests were reviewed and are high. Exacerbating diseases include obesity. He has no history of chronic renal disease, diabetes or hypothyroidism. Factors aggravating his hyperlipidemia include thiazides. Pertinent negatives include no chest pain, focal weakness, myalgias or shortness of breath. Current antihyperlipidemic treatment includes diet change. The current treatment provides significant improvement of lipids. There are no compliance problems.  Risk factors for coronary artery disease include dyslipidemia, male sex, hypertension and obesity.    No problem-specific Assessment & Plan notes found for this encounter.   Past Medical History:  Diagnosis Date  . Erectile dysfunction   . Hypertension     Past Surgical History:  Procedure Laterality Date  . COLONOSCOPY  2011   [facility]- polyps- repeat in 7  . COLONOSCOPY  WITH PROPOFOL N/A 05/31/2015   Procedure: COLONOSCOPY WITH PROPOFOL;  Surgeon: Lucilla Lame, MD;  Location: [place];  Service: Endoscopy;  Laterality: N/A;  . POLYPECTOMY  05/31/2015   Procedure: POLYPECTOMY;  Surgeon: Lucilla Lame, MD;  Location: [place];  Service: Endoscopy;;    History reviewed. No pertinent family history.  Social History   Socioeconomic History  . Marital status: Married    Spouse name: Not on file  . Number of children: Not on file  . Years of education: Not on file  . Highest education level: Not on file  Social Needs  . Financial resource strain: Not on file  . Food insecurity - worry: Not on file  . Food insecurity - inability: Not on file  . Transportation needs - medical: Not on file  . Transportation needs - non-medical: Not on file  Occupational History  . Not on file  Tobacco Use  . Smoking status: Never Smoker  . Smokeless tobacco: Never Used  Substance and Sexual Activity  . Alcohol use: Yes    Alcohol/week: 0.0 oz    Comment: 1 drink/month  . Drug use: No  . Sexual activity: Yes  Other Topics Concern  . Not on file  Social History Narrative  . Not on file    No Known Allergies  Outpatient Medications Prior to Visit  Medication Sig Dispense Refill  . aspirin 81 MG tablet Take 1 tablet (81 mg total) by mouth daily. 30 tablet 11  . fluticasone (FLONASE) 50 MCG/ACT nasal spray Place 2 sprays into both nostrils daily. 16 g 6  . hydrochlorothiazide (HYDRODIURIL) 12.5 MG tablet Take 1 tablet (12.5 mg total) by mouth daily. 90 tablet 1  . quinapril (  ACCUPRIL) 20 MG tablet TAKE 1 TABLET DAILY (NEED APPOINTMENT, SCHEDULE APPOINTMENT) 90 tablet 0  . sildenafil (VIAGRA) 100 MG tablet Take 0.5-1 tablets (50-100 mg total) by mouth daily as needed for erectile dysfunction. 4 tablet 11   No facility-administered medications prior to visit.     Review of Systems  Constitutional: Negative for chills, fever, malaise/fatigue and  weight loss.  HENT: Negative for congestion, ear discharge, ear pain, nosebleeds and sore throat.   Eyes: Negative for blurred vision.  Respiratory: Negative for cough, sputum production, shortness of breath and wheezing.   Cardiovascular: Negative for chest pain, palpitations, orthopnea, leg swelling and PND.  Gastrointestinal: Negative for abdominal pain, blood in stool, constipation, diarrhea, heartburn, melena and nausea.  Genitourinary: Negative for dysuria, frequency, hematuria and urgency.  Musculoskeletal: Negative for back pain, joint pain, myalgias and neck pain.  Skin: Negative for rash.  Neurological: Negative for dizziness, tingling, sensory change, focal weakness and headaches.  Endo/Heme/Allergies: Negative for environmental allergies and polydipsia. Does not bruise/bleed easily.  Psychiatric/Behavioral: Negative for depression and suicidal ideas. The patient is not nervous/anxious and does not have insomnia.      Objective  Vitals:   05/31/17 0814  BP: 120/60  Pulse: 80  Weight: 229 lb (103.9 kg)  Height: 5\' 9"  (1.753 m)    Physical Exam  Constitutional: He is oriented to person, place, and time and well-developed, well-nourished, and in no distress.  HENT:  Head: Normocephalic.  Right Ear: External ear normal.  Left Ear: External ear normal.  Nose: Nose normal.  Mouth/Throat: Oropharynx is clear and moist.  Eyes: Conjunctivae and EOM are normal. Pupils are equal, round, and reactive to light. Right eye exhibits no discharge. Left eye exhibits no discharge. No scleral icterus.  Neck: Normal range of motion. Neck supple. No JVD present. No tracheal deviation present. No thyromegaly present.  Cardiovascular: Normal rate, regular rhythm, normal heart sounds and intact distal pulses. Exam reveals no gallop and no friction rub.  No murmur heard. Pulmonary/Chest: Breath sounds normal. No respiratory distress. He has no wheezes. He has no rales.  Abdominal: Soft. Bowel  sounds are normal. He exhibits no mass. There is no hepatosplenomegaly. There is no tenderness. There is no rebound, no guarding and no CVA tenderness.  Musculoskeletal: Normal range of motion. He exhibits no edema or tenderness.  Lymphadenopathy:    He has no cervical adenopathy.  Neurological: He is alert and oriented to person, place, and time. He has normal sensation, normal strength, normal reflexes and intact cranial nerves. No cranial nerve deficit.  Skin: Skin is warm. No rash noted.  Psychiatric: Mood and affect normal.  Nursing note and vitals reviewed.     Assessment & Plan  Problem List Items Addressed This Visit      Cardiovascular and Mediastinum   Essential hypertension   Relevant Medications   sildenafil (VIAGRA) 100 MG tablet   quinapril (ACCUPRIL) 20 MG tablet   hydrochlorothiazide (HYDRODIURIL) 12.5 MG tablet   Other Relevant Orders   Renal Function Panel     Genitourinary   Erectile dysfunction   Relevant Medications   sildenafil (VIAGRA) 100 MG tablet    Other Visit Diagnoses    Pure hypercholesterolemia    -  Primary   Relevant Medications   sildenafil (VIAGRA) 100 MG tablet   quinapril (ACCUPRIL) 20 MG tablet   hydrochlorothiazide (HYDRODIURIL) 12.5 MG tablet   Other Relevant Orders   Lipid panel   Seasonal allergic rhinitis due to pollen  Relevant Medications   fluticasone (FLONASE) 50 MCG/ACT nasal spray      Meds ordered this encounter  Medications  . sildenafil (VIAGRA) 100 MG tablet    Sig: Take 0.5-1 tablets (50-100 mg total) by mouth daily as needed for erectile dysfunction.    Dispense:  4 tablet    Refill:  11  . quinapril (ACCUPRIL) 20 MG tablet    Sig: One a day    Dispense:  90 tablet    Refill:  1    Need to see  . hydrochlorothiazide (HYDRODIURIL) 12.5 MG tablet    Sig: Take 1 tablet (12.5 mg total) by mouth daily.    Dispense:  90 tablet    Refill:  1  . fluticasone (FLONASE) 50 MCG/ACT nasal spray    Sig: Place 2  sprays into both nostrils daily.    Dispense:  16 g    Refill:  6  Health risks of being over weight were discussed and patient was counseled on weight loss options and exercise.    Dr. Macon [facility]  05/31/17

## 2017-05-31 NOTE — Patient Instructions (Signed)

## 2017-06-01 LAB — RENAL FUNCTION PANEL
ALBUMIN: 4.8 g/dL (ref 3.5–5.5)
BUN / CREAT RATIO: 16 (ref 9–20)
BUN: 15 mg/dL (ref 6–24)
CHLORIDE: 101 mmol/L (ref 96–106)
CO2: 23 mmol/L (ref 20–29)
CREATININE: 0.95 mg/dL (ref 0.76–1.27)
Calcium: 9.3 mg/dL (ref 8.7–10.2)
GFR calc non Af Amer: 88 mL/min/{1.73_m2} (ref >59)
GFR, EST AFRICAN AMERICAN: 102 mL/min/{1.73_m2} (ref >59)
Glucose: 123 mg/dL — ABNORMAL HIGH (ref 65–99)
Phosphorus: 3.6 mg/dL (ref 2.5–4.5)
Potassium: 4.3 mmol/L (ref 3.5–5.2)
Sodium: 142 mmol/L (ref 134–144)

## 2017-06-01 LAB — LIPID PANEL
CHOLESTEROL TOTAL: 251 mg/dL — AB (ref 100–199)
Chol/HDL Ratio: 4.2 ratio (ref 0.0–5.0)
HDL: 60 mg/dL (ref >39)
LDL CALC: 177 mg/dL — AB (ref 0–99)
Triglycerides: 72 mg/dL (ref 0–149)
VLDL CHOLESTEROL CAL: 14 mg/dL (ref 5–40)

## 2017-07-05 ENCOUNTER — Other Ambulatory Visit: Payer: Self-pay

## 2017-11-17 ENCOUNTER — Other Ambulatory Visit: Payer: Self-pay | Admitting: Family Medicine

## 2017-11-17 DIAGNOSIS — I10 Essential (primary) hypertension: Secondary | ICD-10-CM

## 2018-01-14 ENCOUNTER — Other Ambulatory Visit: Payer: Self-pay

## 2018-01-14 DIAGNOSIS — I10 Essential (primary) hypertension: Secondary | ICD-10-CM

## 2018-01-14 MED ORDER — QUINAPRIL HCL 20 MG PO TABS: ORAL_TABLET | ORAL | 0 refills | Status: DC

## 2018-02-07 ENCOUNTER — Ambulatory Visit: Payer: BLUE CROSS/BLUE SHIELD | Admitting: Family Medicine

## 2018-02-07 ENCOUNTER — Encounter: Payer: Self-pay | Admitting: Family Medicine

## 2018-02-07 VITALS — BP 138/80 | HR 80 | Ht 69.0 in | Wt 235.0 lb

## 2018-02-07 DIAGNOSIS — I10 Essential (primary) hypertension: Secondary | ICD-10-CM

## 2018-02-07 DIAGNOSIS — N529 Male erectile dysfunction, unspecified: Secondary | ICD-10-CM | POA: Diagnosis not present

## 2018-02-07 DIAGNOSIS — E782 Mixed hyperlipidemia: Secondary | ICD-10-CM

## 2018-02-07 MED ORDER — HYDROCHLOROTHIAZIDE 12.5 MG PO TABS: 12.5000 mg | ORAL_TABLET | Freq: Every day | ORAL | 1 refills | Status: DC

## 2018-02-07 MED ORDER — QUINAPRIL HCL 20 MG PO TABS: ORAL_TABLET | ORAL | 1 refills | Status: DC

## 2018-02-07 MED ORDER — SILDENAFIL CITRATE 100 MG PO TABS: 100.0000 mg | ORAL_TABLET | Freq: Every day | ORAL | 11 refills | Status: DC | PRN

## 2018-02-07 NOTE — Progress Notes (Signed)
Date:  02/07/2018   Name:  Ryan Parks   DOB:  06/20/1960   MRN:  [id number]   Chief Complaint: Hypertension Hypertension  This is a chronic problem. The current episode started more than 1 year ago. The problem has been gradually improving since onset. The problem is controlled. Pertinent negatives include no anxiety, blurred vision, chest pain, headaches, malaise/fatigue, neck pain, orthopnea, palpitations, peripheral edema, PND, shortness of breath or sweats. There are no associated agents to hypertension. There are no known risk factors for coronary artery disease. Past treatments include ACE inhibitors and diuretics. The current treatment provides moderate improvement. There are no compliance problems.  There is no history of angina, kidney disease, CAD/MI, CVA, heart failure, left ventricular hypertrophy, PVD or retinopathy. There is no history of chronic renal disease, a hypertension causing med or renovascular disease.  Hyperlipidemia  This is a chronic problem. The current episode started more than 1 year ago. The problem is controlled. He has no history of chronic renal disease. Factors aggravating his hyperlipidemia include thiazides. Pertinent negatives include no chest pain, focal sensory loss, focal weakness, leg pain, myalgias or shortness of breath. Current antihyperlipidemic treatment includes diet change (omega 3). The current treatment provides moderate improvement of lipids. There are no compliance problems.      Review of Systems  Constitutional: Negative for appetite change, chills, fatigue, fever, malaise/fatigue and unexpected weight change.  HENT: Negative for drooling, ear discharge, ear pain, facial swelling, hearing loss, nosebleeds, sneezing, sore throat and trouble swallowing.   Eyes: Negative for blurred vision, photophobia, pain, discharge, redness, itching and visual disturbance.  Respiratory: Negative for cough, choking, chest tightness, shortness of breath  and wheezing.   Cardiovascular: Negative for chest pain, palpitations, orthopnea, leg swelling and PND.  Gastrointestinal: Negative for abdominal pain, blood in stool and nausea.  Endocrine: Negative for cold intolerance, heat intolerance, polydipsia, polyphagia and polyuria.  Genitourinary: Negative for decreased urine volume, difficulty urinating, dysuria, flank pain, frequency and hematuria.  Musculoskeletal: Negative for back pain, joint swelling, myalgias, neck pain and neck stiffness.  Skin: Negative for color change and rash.  Allergic/Immunologic: Negative for environmental allergies and immunocompromised state.  Neurological: Negative for dizziness, focal weakness, seizures, light-headedness, numbness and headaches.  Hematological: Does not bruise/bleed easily.  Psychiatric/Behavioral: Negative for confusion and dysphoric mood. The patient is not nervous/anxious.     Patient Active Problem List   Diagnosis Date Noted  . Erectile dysfunction due to arterial insufficiency 03/13/2016  . Erectile dysfunction 03/13/2016  . Personal history of colonic polyps   . Benign neoplasm of ascending colon   . Benign neoplasm of descending colon   . Benign neoplasm of sigmoid colon   . Essential hypertension 11/01/2014    No Known Allergies  Past Surgical History:  Procedure Laterality Date  . COLONOSCOPY  2011   [facility]- polyps- repeat in 7  . COLONOSCOPY WITH PROPOFOL N/A 05/31/2015   Procedure: COLONOSCOPY WITH PROPOFOL;  Surgeon: Lucilla Lame, MD;  Location: [place];  Service: Endoscopy;  Laterality: N/A;  . POLYPECTOMY  05/31/2015   Procedure: POLYPECTOMY;  Surgeon: Lucilla Lame, MD;  Location: [place];  Service: Endoscopy;;    Social History   Tobacco Use  . Smoking status: Never Smoker  . Smokeless tobacco: Never Used  Substance Use Topics  . Alcohol use: Yes    Alcohol/week: 0.0 standard drinks    Comment: 1 drink/month  . Drug use: No      Medication  list has been reviewed and updated.  Current Meds  Medication Sig  . aspirin 81 MG tablet Take 1 tablet (81 mg total) by mouth daily.  . fluticasone (FLONASE) 50 MCG/ACT nasal spray Place 2 sprays into both nostrils daily.  . hydrochlorothiazide (HYDRODIURIL) 12.5 MG tablet TAKE 1 TABLET DAILY  . quinapril (ACCUPRIL) 20 MG tablet One a day  . sildenafil (VIAGRA) 100 MG tablet Take 0.5-1 tablets (50-100 mg total) by mouth daily as needed for erectile dysfunction.    PHQ 2/9 Scores 05/31/2017  PHQ - 2 Score 0  PHQ- 9 Score 0    Physical Exam  Constitutional: He is oriented to person, place, and time.  HENT:  Head: Normocephalic.  Right Ear: External ear normal.  Left Ear: External ear normal.  Nose: Nose normal.  Mouth/Throat: Oropharynx is clear and moist.  Eyes: Pupils are equal, round, and reactive to light. Conjunctivae and EOM are normal. Right eye exhibits no discharge. Left eye exhibits no discharge. No scleral icterus.  Neck: Normal range of motion. Neck supple. No JVD present. No tracheal deviation present. No thyromegaly present.  Cardiovascular: Normal rate, regular rhythm, normal heart sounds and intact distal pulses. Exam reveals no gallop and no friction rub.  No murmur heard. Pulmonary/Chest: Breath sounds normal. No respiratory distress. He has no wheezes. He has no rales.  Abdominal: Soft. Bowel sounds are normal. He exhibits no mass. There is no hepatosplenomegaly. There is no tenderness. There is no rebound, no guarding and no CVA tenderness.  Musculoskeletal: Normal range of motion. He exhibits no edema or tenderness.  Lymphadenopathy:    He has no cervical adenopathy.  Neurological: He is alert and oriented to person, place, and time. He has normal strength and normal reflexes. No cranial nerve deficit.  Skin: Skin is warm. No rash noted.  Nursing note and vitals reviewed.   BP 138/80   Pulse 80   Ht 5\' 9"  (1.753 m)   Wt 235 lb (106.6 kg)    BMI 34.70 kg/m   Assessment and Plan:  1. Essential hypertension Chronic Controlled Continue ACCUPRIL 20 mg and HCTZ 12.5until   - hydrochlorothiazide (HYDRODIURIL) 12.5 MG tablet; Take 1 tablet (12.5 mg total) by mouth daily.  Dispense: 90 tablet; Refill: 1 - quinapril (ACCUPRIL) 20 MG tablet; One a day  Dispense: 90 tablet; Refill: 1  2. Erectile dysfunction, unspecified erectile dysfunction type Chronic As need. Will reill sildenafil 100 mg as needed. - sildenafil (VIAGRA) 100 MG tablet; Take 1 tablet (100 mg total) by mouth daily as needed for erectile dysfunction.  Dispense: 12 tablet; Refill: 11  3. Mixed hyperlipidemia Chronic Controlled Continue Omega 3 daily.    Dr. Mac[facility]  02/07/2018

## 2018-02-16 ENCOUNTER — Other Ambulatory Visit: Payer: Self-pay

## 2018-02-16 DIAGNOSIS — I10 Essential (primary) hypertension: Secondary | ICD-10-CM

## 2018-02-16 MED ORDER — QUINAPRIL HCL 20 MG PO TABS: ORAL_TABLET | ORAL | 1 refills | Status: DC

## 2018-02-16 MED ORDER — HYDROCHLOROTHIAZIDE 12.5 MG PO TABS: 12.5000 mg | ORAL_TABLET | Freq: Every day | ORAL | 1 refills | Status: DC

## 2018-05-09 ENCOUNTER — Ambulatory Visit: Payer: BLUE CROSS/BLUE SHIELD | Admitting: Family Medicine

## 2018-05-09 ENCOUNTER — Encounter: Payer: Self-pay | Admitting: Family Medicine

## 2018-05-09 VITALS — BP 120/80 | HR 80 | Ht 69.0 in | Wt 232.0 lb

## 2018-05-09 DIAGNOSIS — Z1211 Encounter for screening for malignant neoplasm of colon: Secondary | ICD-10-CM

## 2018-05-09 DIAGNOSIS — E782 Mixed hyperlipidemia: Secondary | ICD-10-CM | POA: Diagnosis not present

## 2018-05-09 DIAGNOSIS — I10 Essential (primary) hypertension: Secondary | ICD-10-CM | POA: Diagnosis not present

## 2018-05-09 MED ORDER — QUINAPRIL HCL 20 MG PO TABS: ORAL_TABLET | ORAL | 1 refills | Status: DC

## 2018-05-09 MED ORDER — HYDROCHLOROTHIAZIDE 12.5 MG PO TABS: 12.5000 mg | ORAL_TABLET | Freq: Every day | ORAL | 1 refills | Status: DC

## 2018-05-09 NOTE — Progress Notes (Signed)
Date:  05/09/2018   Name:  Ryan Parks   DOB:  09-17-1960   MRN:  [id number]   Chief Complaint: Hypertension  Hypertension  This is a chronic problem. The current episode started more than 1 year ago. The problem is unchanged. The problem is controlled. Pertinent negatives include no anxiety, blurred vision, chest pain, headaches, malaise/fatigue, neck pain, orthopnea, palpitations, peripheral edema, PND, shortness of breath or sweats. There are no associated agents to hypertension. There are no known risk factors for coronary artery disease. Past treatments include ACE inhibitors and diuretics. The current treatment provides moderate improvement. There are no compliance problems.  There is no history of angina, kidney disease, CAD/MI, CVA, heart failure, left ventricular hypertrophy, PVD or retinopathy. There is no history of chronic renal disease, a hypertension causing med or renovascular disease.    Review of Systems  Constitutional: Negative for chills, fever and malaise/fatigue.  HENT: Negative for drooling, ear discharge, ear pain and sore throat.   Eyes: Negative for blurred vision.  Respiratory: Negative for cough, shortness of breath and wheezing.   Cardiovascular: Negative for chest pain, palpitations, orthopnea, leg swelling and PND.  Gastrointestinal: Negative for abdominal pain, blood in stool, constipation, diarrhea and nausea.  Endocrine: Negative for polydipsia.  Genitourinary: Negative for dysuria, frequency, hematuria and urgency.  Musculoskeletal: Negative for back pain, myalgias and neck pain.  Skin: Negative for rash.  Allergic/Immunologic: Negative for environmental allergies.  Neurological: Negative for dizziness and headaches.  Hematological: Does not bruise/bleed easily.  Psychiatric/Behavioral: Negative for suicidal ideas. The patient is not nervous/anxious.     Patient Active Problem List   Diagnosis Date Noted  . Erectile dysfunction due to arterial  insufficiency 03/13/2016  . Erectile dysfunction 03/13/2016  . Personal history of colonic polyps   . Benign neoplasm of ascending colon   . Benign neoplasm of descending colon   . Benign neoplasm of sigmoid colon   . Essential hypertension 11/01/2014    No Known Allergies  Past Surgical History:  Procedure Laterality Date  . COLONOSCOPY  2011   [facility]- polyps- repeat in 7  . COLONOSCOPY WITH PROPOFOL N/A 05/31/2015   Procedure: COLONOSCOPY WITH PROPOFOL;  Surgeon: Lucilla Lame, MD;  Location: [place];  Service: Endoscopy;  Laterality: N/A;  . POLYPECTOMY  05/31/2015   Procedure: POLYPECTOMY;  Surgeon: Lucilla Lame, MD;  Location: [place];  Service: Endoscopy;;    Social History   Tobacco Use  . Smoking status: Never Smoker  . Smokeless tobacco: Never Used  Substance Use Topics  . Alcohol use: Yes    Alcohol/week: 0.0 standard drinks    Comment: 1 drink/month  . Drug use: No     Medication list has been reviewed and updated.  Current Meds  Medication Sig  . aspirin 81 MG tablet Take 1 tablet (81 mg total) by mouth daily.  . fluticasone (FLONASE) 50 MCG/ACT nasal spray Place 2 sprays into both nostrils daily.  . hydrochlorothiazide (HYDRODIURIL) 12.5 MG tablet Take 1 tablet (12.5 mg total) by mouth daily.  . quinapril (ACCUPRIL) 20 MG tablet One a day  . sildenafil (VIAGRA) 100 MG tablet Take 1 tablet (100 mg total) by mouth daily as needed for erectile dysfunction.    PHQ 2/9 Scores 05/31/2017  PHQ - 2 Score 0  PHQ- 9 Score 0    Physical Exam Vitals signs and nursing note reviewed.  HENT:     Head: Normocephalic.     Right Ear: External  ear normal.     Left Ear: External ear normal.     Nose: Nose normal.  Eyes:     General: No scleral icterus.       Right eye: No discharge.        Left eye: No discharge.     Conjunctiva/sclera: Conjunctivae normal.     Pupils: Pupils are equal, round, and reactive to light.  Neck:      Musculoskeletal: Normal range of motion and neck supple.     Thyroid: No thyromegaly.     Vascular: No JVD.     Trachea: No tracheal deviation.  Cardiovascular:     Rate and Rhythm: Normal rate and regular rhythm.     Chest Wall: PMI is not displaced. No thrill.     Pulses:          Carotid pulses are 2+ on the right side and 2+ on the left side.      Radial pulses are 2+ on the right side and 2+ on the left side.       Femoral pulses are 2+ on the right side and 2+ on the left side.      Popliteal pulses are 2+ on the right side and 2+ on the left side.       Dorsalis pedis pulses are 2+ on the right side and 2+ on the left side.       Posterior tibial pulses are 2+ on the right side and 2+ on the left side.     Heart sounds: Normal heart sounds, S1 normal and S2 normal. Heart sounds not distant. No murmur. No systolic murmur. No diastolic murmur. No friction rub. No gallop. No S3 or S4 sounds.   Pulmonary:     Effort: No respiratory distress.     Breath sounds: Normal breath sounds. No wheezing or rales.  Abdominal:     General: Bowel sounds are normal.     Palpations: Abdomen is soft. There is no mass.     Tenderness: There is no abdominal tenderness. There is no guarding or rebound.  Musculoskeletal: Normal range of motion.        General: No tenderness.     Right lower leg: No edema.     Left lower leg: No edema.  Lymphadenopathy:     Cervical: No cervical adenopathy.  Skin:    General: Skin is warm.     Findings: No rash.  Neurological:     Mental Status: He is alert and oriented to person, place, and time.     Cranial Nerves: No cranial nerve deficit.     Deep Tendon Reflexes: Reflexes are normal and symmetric.     BP 120/80   Pulse 80   Ht 5\' 9"  (1.753 m)   Wt 232 lb (105.2 kg)   BMI 34.26 kg/m   Assessment and Plan: 1. Essential hypertension Chronic.  Controlled.  Will continue hydrochlorothiazide 12.5 and Accupril 20 mg once a day.  Will obtain renal function  panel to evaluate normal from stances. - hydrochlorothiazide (HYDRODIURIL) 12.5 MG tablet; Take 1 tablet (12.5 mg total) by mouth daily.  Dispense: 90 tablet; Refill: 1 - quinapril (ACCUPRIL) 20 MG tablet; One a day  Dispense: 90 tablet; Refill: 1 - Renal Function Panel  2. Moderate mixed hyperlipidemia not requiring statin therapy Patient has had elevated cholesterol in the past particular the LDL variety will obtain a lipid panel - Lipid panel  3. Colon cancer screening Had a previous  anoscopy with Dr.Wohl.  Apparently there were some atypical polyps needing a ablation after 3 years.  Is noted that he would be due in February of this year initiated by GI consult.- Ambulatory referral to Gastroenterology

## 2018-05-10 LAB — RENAL FUNCTION PANEL
ALBUMIN: 4.9 g/dL (ref 3.5–5.5)
BUN / CREAT RATIO: 13 (ref 9–20)
BUN: 15 mg/dL (ref 6–24)
CALCIUM: 9.6 mg/dL (ref 8.7–10.2)
CHLORIDE: 104 mmol/L (ref 96–106)
CO2: 23 mmol/L (ref 20–29)
Creatinine, Ser: 1.17 mg/dL (ref 0.76–1.27)
GFR calc Af Amer: 79 mL/min/{1.73_m2} (ref >59)
GFR calc non Af Amer: 68 mL/min/{1.73_m2} (ref >59)
GLUCOSE: 100 mg/dL — AB (ref 65–99)
POTASSIUM: 4.3 mmol/L (ref 3.5–5.2)
Phosphorus: 3.7 mg/dL (ref 2.8–4.1)
Sodium: 142 mmol/L (ref 134–144)

## 2018-05-10 LAB — LIPID PANEL
CHOLESTEROL TOTAL: 282 mg/dL — AB (ref 100–199)
Chol/HDL Ratio: 4.9 ratio (ref 0.0–5.0)
HDL: 57 mg/dL (ref >39)
LDL Calculated: 196 mg/dL — ABNORMAL HIGH (ref 0–99)
Triglycerides: 147 mg/dL (ref 0–149)
VLDL Cholesterol Cal: 29 mg/dL (ref 5–40)

## 2018-06-09 ENCOUNTER — Telehealth: Payer: Self-pay | Admitting: Gastroenterology

## 2018-06-09 ENCOUNTER — Other Ambulatory Visit: Payer: Self-pay

## 2018-06-09 NOTE — Telephone Encounter (Signed)
Patient returning Michelle's call  °

## 2018-06-09 NOTE — Telephone Encounter (Signed)
Returned patients call.  He has been scheduled colonoscopy with Dr. [name] for 07/01/18 at [facility].  Thanks [employer]

## 2018-07-01 ENCOUNTER — Ambulatory Visit
Admission: RE | Admit: 2018-07-01 | Discharge: 2018-07-01 | Disposition: A | Discharge status: Home / Self Care | Payer: BLUE CROSS/BLUE SHIELD | Attending: Gastroenterology | Admitting: Gastroenterology

## 2018-07-01 ENCOUNTER — Ambulatory Visit: Payer: BLUE CROSS/BLUE SHIELD | Admitting: Anesthesiology

## 2018-07-01 ENCOUNTER — Encounter
Admission: RE | Disposition: A | Discharge status: Home / Self Care | Payer: Self-pay | Source: Home / Self Care | Attending: Gastroenterology

## 2018-07-01 DIAGNOSIS — D125 Benign neoplasm of sigmoid colon: Secondary | ICD-10-CM | POA: Diagnosis not present

## 2018-07-01 DIAGNOSIS — D120 Benign neoplasm of cecum: Secondary | ICD-10-CM

## 2018-07-01 DIAGNOSIS — D124 Benign neoplasm of descending colon: Secondary | ICD-10-CM | POA: Diagnosis not present

## 2018-07-01 DIAGNOSIS — Z1211 Encounter for screening for malignant neoplasm of colon: Secondary | ICD-10-CM | POA: Insufficient documentation

## 2018-07-01 DIAGNOSIS — Z6833 Body mass index (BMI) 33.0-33.9, adult: Secondary | ICD-10-CM | POA: Diagnosis not present

## 2018-07-01 DIAGNOSIS — K635 Polyp of colon: Secondary | ICD-10-CM | POA: Diagnosis not present

## 2018-07-01 DIAGNOSIS — I10 Essential (primary) hypertension: Secondary | ICD-10-CM | POA: Insufficient documentation

## 2018-07-01 DIAGNOSIS — Z79899 Other long term (current) drug therapy: Secondary | ICD-10-CM | POA: Diagnosis not present

## 2018-07-01 DIAGNOSIS — Z7982 Long term (current) use of aspirin: Secondary | ICD-10-CM | POA: Insufficient documentation

## 2018-07-01 DIAGNOSIS — Z86010 Personal history of colonic polyps: Secondary | ICD-10-CM | POA: Diagnosis not present

## 2018-07-01 DIAGNOSIS — D122 Benign neoplasm of ascending colon: Secondary | ICD-10-CM

## 2018-07-01 DIAGNOSIS — K640 First degree hemorrhoids: Secondary | ICD-10-CM | POA: Insufficient documentation

## 2018-07-01 HISTORY — PX: COLONOSCOPY WITH PROPOFOL: SHX5780

## 2018-07-01 HISTORY — PX: POLYPECTOMY: SHX5525

## 2018-07-01 SURGERY — COLONOSCOPY WITH PROPOFOL
Anesthesia: General | Site: Rectum

## 2018-07-01 MED ORDER — STERILE WATER FOR IRRIGATION IR SOLN
Status: DC | PRN
  Administered 2018-07-01: 09:00:00

## 2018-07-01 MED ORDER — OXYCODONE HCL 5 MG/5ML PO SOLN: 5.0000 mg | Freq: Once | ORAL | Status: DC | PRN

## 2018-07-01 MED ORDER — LACTATED RINGERS IV SOLN
INTRAVENOUS | Status: DC
  Administered 2018-07-01: 08:00:00 via INTRAVENOUS

## 2018-07-01 MED ORDER — LIDOCAINE HCL (CARDIAC) PF 100 MG/5ML IV SOSY
PREFILLED_SYRINGE | INTRAVENOUS | Status: DC | PRN
  Administered 2018-07-01: 40 mg via INTRAVENOUS

## 2018-07-01 MED ORDER — PROPOFOL 10 MG/ML IV BOLUS
INTRAVENOUS | Status: DC | PRN
  Administered 2018-07-01: 100 mg via INTRAVENOUS
  Administered 2018-07-01: 50 mg via INTRAVENOUS
  Administered 2018-07-01 (×2): 20 mg via INTRAVENOUS
  Administered 2018-07-01: 40 mg via INTRAVENOUS
  Administered 2018-07-01: 30 mg via INTRAVENOUS
  Administered 2018-07-01: 20 mg via INTRAVENOUS
  Administered 2018-07-01: 50 mg via INTRAVENOUS
  Administered 2018-07-01: 20 mg via INTRAVENOUS
  Administered 2018-07-01: 30 mg via INTRAVENOUS
  Administered 2018-07-01: 20 mg via INTRAVENOUS

## 2018-07-01 MED ORDER — OXYCODONE HCL 5 MG PO TABS: 5.0000 mg | ORAL_TABLET | Freq: Once | ORAL | Status: DC | PRN

## 2018-07-01 SURGICAL SUPPLY — 7 items
CANISTER SUCT 1200ML W/VALVE (MISCELLANEOUS) ×2 IMPLANT
GOWN CVR UNV OPN BCK APRN NK (MISCELLANEOUS) ×2 IMPLANT
GOWN ISOL THUMB LOOP REG UNIV (MISCELLANEOUS) ×2
KIT ENDO PROCEDURE OLY (KITS) ×2 IMPLANT
SNARE LASSO HEX 3 IN 1 (INSTRUMENTS) ×2 IMPLANT
TRAP ETRAP POLY (MISCELLANEOUS) ×2 IMPLANT
WATER STERILE IRR 250ML POUR (IV SOLUTION) ×2 IMPLANT

## 2018-07-01 NOTE — Discharge Instructions (Signed)
General Anesthesia, Adult, Care After  This sheet gives you information about how to care for yourself after your procedure. Your health care provider may also give you more specific instructions. If you have problems or questions, contact your health care provider.  What can I expect after the procedure?  After the procedure, the following side effects are common:  Pain or discomfort at the IV site.  Nausea.  Vomiting.  Sore throat.  Trouble concentrating.  Feeling cold or chills.  Weak or tired.  Sleepiness and fatigue.  Soreness and body aches. These side effects can affect parts of the body that were not involved in surgery.  Follow these instructions at home:    For at least 24 hours after the procedure:  Have a responsible adult stay with you. It is important to have someone help care for you until you are awake and alert.  Rest as needed.  Do not:  Participate in activities in which you could fall or become injured.  Drive.  Use heavy machinery.  Drink alcohol.  Take sleeping pills or medicines that cause drowsiness.  Make important decisions or sign legal documents.  Take care of children on your own.  Eating and drinking  Follow any instructions from your health care provider about eating or drinking restrictions.  When you feel hungry, start by eating small amounts of foods that are soft and easy to digest (bland), such as toast. Gradually return to your regular diet.  Drink enough fluid to keep your urine pale yellow.  If you vomit, rehydrate by drinking water, juice, or clear broth.  General instructions  If you have sleep apnea, surgery and certain medicines can increase your risk for breathing problems. Follow instructions from your health care provider about wearing your sleep device:  Anytime you are sleeping, including during daytime naps.  While taking prescription pain medicines, sleeping medicines, or medicines that make you drowsy.  Return to your normal activities as told by your health care  provider. Ask your health care provider what activities are safe for you.  Take over-the-counter and prescription medicines only as told by your health care provider.  If you smoke, do not smoke without supervision.  Keep all follow-up visits as told by your health care provider. This is important.  Contact a health care provider if:  You have nausea or vomiting that does not get better with medicine.  You cannot eat or drink without vomiting.  You have pain that does not get better with medicine.  You are unable to pass urine.  You develop a skin rash.  You have a fever.  You have redness around your IV site that gets worse.  Get help right away if:  You have difficulty breathing.  You have chest pain.  You have blood in your urine or stool, or you vomit blood.  Summary  After the procedure, it is common to have a sore throat or nausea. It is also common to feel tired.  Have a responsible adult stay with you for the first 24 hours after general anesthesia. It is important to have someone help care for you until you are awake and alert.  When you feel hungry, start by eating small amounts of foods that are soft and easy to digest (bland), such as toast. Gradually return to your regular diet.  Drink enough fluid to keep your urine pale yellow.  Return to your normal activities as told by your health care provider. Ask your health care   provider what activities are safe for you.  This information is not intended to replace advice given to you by your health care provider. Make sure you discuss any questions you have with your health care provider.  Document Released: 07/20/2000 Document Revised: 11/27/2016 Document Reviewed: 11/27/2016  Elsevier Interactive Patient Education  2019 [employer]

## 2018-07-01 NOTE — Anesthesia Preprocedure Evaluation (Signed)
Anesthesia Evaluation  Patient identified by MRN, date of birth, ID band  Reviewed: NPO status   History of Anesthesia Complications Negative for: history of anesthetic complications  Airway Mallampati: II  TM Distance: >3 FB Neck ROM: full    Dental no notable dental hx.    Pulmonary neg pulmonary ROS,    Pulmonary exam normal        Cardiovascular Exercise Tolerance: Good hypertension, Normal cardiovascular exam     Neuro/Psych negative neurological ROS  negative psych ROS   GI/Hepatic negative GI ROS, Neg liver ROS,   Endo/Other  Morbid obesity (bmi 33)  Renal/GU negative Renal ROS  negative genitourinary   Musculoskeletal   Abdominal   Peds  Hematology negative hematology ROS (+)   Anesthesia Other Findings   Reproductive/Obstetrics                             Anesthesia Physical Anesthesia Plan  ASA: II  Anesthesia Plan: General   Post-op Pain Management:    Induction:   PONV Risk Score and Plan:   Airway Management Planned: Natural Airway  Additional Equipment:   Intra-op Plan:   Post-operative Plan:   Informed Consent: I have reviewed the patients History and Physical, chart, labs and discussed the procedure including the risks, benefits and alternatives for the proposed anesthesia with the patient or authorized representative who has indicated his/her understanding and acceptance.       Plan Discussed with: CRNA  Anesthesia Plan Comments:         Anesthesia Quick Evaluation

## 2018-07-01 NOTE — Anesthesia Postprocedure Evaluation (Signed)
Anesthesia Post Note  Patient: Ryan Parks  Procedure(s) Performed: COLONOSCOPY WITH BIOPSY (N/A Rectum) POLYPECTOMY (N/A Rectum)  Patient location during evaluation: PACU Anesthesia Type: General Level of consciousness: awake and alert Pain management: pain level controlled Vital Signs Assessment: post-procedure vital signs reviewed and stable Respiratory status: spontaneous breathing, nonlabored ventilation, respiratory function stable and patient connected to nasal cannula oxygen Cardiovascular status: blood pressure returned to baseline and stable Postop Assessment: no apparent nausea or vomiting Anesthetic complications: no    Kaydense Rizo

## 2018-07-01 NOTE — Op Note (Signed)
[facility] Gastroenterology Patient Name: Ryan Parks Procedure Date: 07/01/2018 8:35 AM MRN: [id number] Account #: [id number] Date of Birth: 1961/03/07 Admit Type: Outpatient Age: 58 Room: [facility] OR ROOM 01 Gender: Male Note Status: Finalized Procedure:            Colonoscopy Indications:          High risk colon cancer surveillance: Personal history                        of colonic polyps Providers:            Lucilla Lame MD, MD Referring MD:         Juline Patch, MD (Referring MD) Medicines:            Propofol per Anesthesia Complications:        No immediate complications. Procedure:            Pre-Anesthesia Assessment:                       - Prior to the procedure, a History and Physical was                        performed, and patient medications and allergies were                        reviewed. The patient's tolerance of previous                        anesthesia was also reviewed. The risks and benefits of                        the procedure and the sedation options and risks were                        discussed with the patient. All questions were                        answered, and informed consent was obtained. Prior                        Anticoagulants: The patient has taken no previous                        anticoagulant or antiplatelet agents. ASA Grade                        Assessment: II - A patient with mild systemic disease.                        After reviewing the risks and benefits, the patient was                        deemed in satisfactory condition to undergo the                        procedure.                       After obtaining informed consent, the colonoscope was  passed under direct vision. Throughout the procedure,                        the patient's blood pressure, pulse, and oxygen                        saturations were monitored continuously. The was                         introduced through the anus and advanced to the the                        cecum, identified by appendiceal orifice and ileocecal                        valve. The colonoscopy was performed without                        difficulty. The patient tolerated the procedure well.                        The quality of the bowel preparation was excellent. Findings:      The perianal and digital rectal examinations were normal.      A 3 mm polyp was found in the cecum. The polyp was sessile. The polyp       was removed with a cold snare. Resection and retrieval were complete.      Three sessile polyps were found in the ascending colon. The polyps were       3 to 5 mm in size. These polyps were removed with a cold snare.       Resection and retrieval were complete.      Four sessile polyps were found in the descending colon. The polyps were       3 to 5 mm in size. These polyps were removed with a cold snare.       Resection and retrieval were complete.      Six sessile polyps were found in the sigmoid colon. The polyps were 3 to       6 mm in size. These polyps were removed with a cold snare. Resection and       retrieval were complete.      Non-bleeding internal hemorrhoids were found during retroflexion. The       hemorrhoids were Grade I (internal hemorrhoids that do not prolapse). Impression:           - One 3 mm polyp in the cecum, removed with a cold                        snare. Resected and retrieved.                       - Three 3 to 5 mm polyps in the ascending colon,                        removed with a cold snare. Resected and retrieved.                       - Four 3 to 5 mm polyps in the descending colon,  removed with a cold snare. Resected and retrieved.                       - Six 3 to 6 mm polyps in the sigmoid colon, removed                        with a cold snare. Resected and retrieved.                       - Non-bleeding internal  hemorrhoids. Recommendation:       - Discharge patient to home.                       - Resume previous diet.                       - Continue present medications. Procedure Code(s):    --- Professional ---                       [phone], Colonoscopy, flexible; with removal of tumor(s),                        polyp(s), or other lesion(s) by snare technique Diagnosis Code(s):    --- Professional ---                       Z86.010, Personal history of colonic polyps                       D12.0, Benign neoplasm of cecum                       D12.2, Benign neoplasm of ascending colon                       D12.4, Benign neoplasm of descending colon                       D12.5, Benign neoplasm of sigmoid colon CPT copyright 2018 American Medical Association. All rights reserved. The codes documented in this report are preliminary and upon coder review may  be revised to meet current compliance requirements. Lucilla Lame MD, MD 07/01/2018 9:03:21 AM This report has been signed electronically. Number of Addenda: 0 Note Initiated On: 07/01/2018 8:35 AM Scope Withdrawal Time: 0 hours 15 minutes 55 seconds  Total Procedure Duration: 0 hours 17 minutes 48 seconds       [facility]

## 2018-07-01 NOTE — Transfer of Care (Signed)
Immediate Anesthesia Transfer of Care Note  Patient: Ryan Parks  Procedure(s) Performed: COLONOSCOPY WITH BIOPSY (N/A Rectum) POLYPECTOMY (N/A Rectum)  Patient Location: PACU  Anesthesia Type: General  Level of Consciousness: awake, alert  and patient cooperative  Airway and Oxygen Therapy: Patient Spontanous Breathing and Patient connected to supplemental oxygen  Post-op Assessment: Post-op Vital signs reviewed, Patient's Cardiovascular Status Stable, Respiratory Function Stable, Patent Airway and No signs of Nausea or vomiting  Post-op Vital Signs: Reviewed and stable  Complications: No apparent anesthesia complications

## 2018-07-01 NOTE — H&P (Signed)
Lucilla Lame, MD [address] [place], [place] [address] Phone:[phone] Fax : [phone]  Primary Care Physician:  Juline Patch, MD Primary Gastroenterologist:  Dr. [name]  Pre-Procedure History & Physical: HPI:  Ryan Parks is a 58 y.o. male is here for an colonoscopy.   Past Medical History:  Diagnosis Date  . Erectile dysfunction   . Hypertension     Past Surgical History:  Procedure Laterality Date  . COLONOSCOPY  2011   [facility]- polyps- repeat in 7  . COLONOSCOPY WITH PROPOFOL N/A 05/31/2015   Procedure: COLONOSCOPY WITH PROPOFOL;  Surgeon: Lucilla Lame, MD;  Location: [place];  Service: Endoscopy;  Laterality: N/A;  . POLYPECTOMY  05/31/2015   Procedure: POLYPECTOMY;  Surgeon: Lucilla Lame, MD;  Location: [place];  Service: Endoscopy;;    Prior to Admission medications   Medication Sig Start Date End Date Taking? Authorizing Provider  aspirin 81 MG tablet Take 1 tablet (81 mg total) by mouth daily. 03/13/16  Yes Juline Patch, MD  hydrochlorothiazide (HYDRODIURIL) 12.5 MG tablet Take 1 tablet (12.5 mg total) by mouth daily. 05/09/18  Yes Juline Patch, MD  Omega-3 Fatty Acids (FISH OIL) 1000 MG CAPS Take by mouth.   Yes [provider]  quinapril (ACCUPRIL) 20 MG tablet One a day 05/09/18  Yes Juline Patch, MD  fluticasone (FLONASE) 50 MCG/ACT nasal spray Place 2 sprays into both nostrils daily. Patient not taking: Reported on 06/20/2018 05/31/17   Juline Patch, MD  sildenafil (VIAGRA) 100 MG tablet Take 1 tablet (100 mg total) by mouth daily as needed for erectile dysfunction. Patient not taking: Reported on 06/20/2018 02/07/18   Juline Patch, MD    Allergies as of 06/09/2018  . (No Known Allergies)    History reviewed. No pertinent family history.  Social History   Socioeconomic History  . Marital status: Married    Spouse name: Not on file  . Number of children: Not on file  . Years of  education: Not on file  . Highest education level: Not on file  Occupational History  . Not on file  Social Needs  . Financial resource strain: Not on file  . Food insecurity:    Worry: Not on file    Inability: Not on file  . Transportation needs:    Medical: Not on file    Non-medical: Not on file  Tobacco Use  . Smoking status: Never Smoker  . Smokeless tobacco: Never Used  Substance and Sexual Activity  . Alcohol use: Yes    Alcohol/week: 0.0 standard drinks    Comment: 1-2 drinks/month  . Drug use: No  . Sexual activity: Yes  Lifestyle  . Physical activity:    Days per week: Not on file    Minutes per session: Not on file  . Stress: Not on file  Relationships  . Social connections:    Talks on phone: Not on file    Gets together: Not on file    Attends religious service: Not on file    Active member of club or organization: Not on file    Attends meetings of clubs or organizations: Not on file    Relationship status: Not on file  . Intimate partner violence:    Fear of current or ex partner: Not on file    Emotionally abused: Not on file    Physically abused: Not on file    Forced sexual activity: Not on  file  Other Topics Concern  . Not on file  Social History Narrative  . Not on file    Review of Systems: See HPI, otherwise negative ROS  Physical Exam: Ht 5\' 9"  (1.753 m)   Wt 101.2 kg   BMI 32.93 kg/m  General:   Alert,  pleasant and cooperative in NAD Head:  Normocephalic and atraumatic. Neck:  Supple; no masses or thyromegaly. Lungs:  Clear throughout to auscultation.    Heart:  Regular rate and rhythm. Abdomen:  Soft, nontender and nondistended. Normal bowel sounds, without guarding, and without rebound.   Neurologic:  Alert and  oriented x4;  grossly normal neurologically.  Impression/Plan: Ryan Parks is here for an colonoscopy to be performed for history of colon polyps  Risks, benefits, limitations, and alternatives regarding   colonoscopy have been reviewed with the patient.  Questions have been answered.  All parties agreeable.   Lucilla Lame, MD  07/01/2018, 7:59 AM

## 2018-07-01 NOTE — Anesthesia Procedure Notes (Signed)
Procedure Name: MAC Date/Time: 07/01/2018 8:38 AM Performed by: Janna Arch, CRNA Pre-anesthesia Checklist: Patient identified, Emergency Drugs available and Suction available Patient Re-evaluated:Patient Re-evaluated prior to induction Oxygen Delivery Method: Nasal cannula

## 2018-07-04 ENCOUNTER — Encounter: Payer: Self-pay | Admitting: Gastroenterology

## 2018-07-05 ENCOUNTER — Encounter: Payer: Self-pay | Admitting: Gastroenterology

## 2018-11-10 ENCOUNTER — Other Ambulatory Visit: Payer: Self-pay | Admitting: Family Medicine

## 2018-11-10 DIAGNOSIS — I10 Essential (primary) hypertension: Secondary | ICD-10-CM

## 2018-11-11 ENCOUNTER — Ambulatory Visit: Payer: Self-pay | Admitting: Family Medicine

## 2018-11-16 ENCOUNTER — Ambulatory Visit: Payer: BLUE CROSS/BLUE SHIELD | Admitting: Family Medicine

## 2018-11-16 ENCOUNTER — Other Ambulatory Visit: Payer: Self-pay

## 2018-11-16 ENCOUNTER — Encounter: Payer: Self-pay | Admitting: Family Medicine

## 2018-11-16 VITALS — BP 110/70 | HR 80 | Ht 69.0 in | Wt 232.0 lb

## 2018-11-16 DIAGNOSIS — E782 Mixed hyperlipidemia: Secondary | ICD-10-CM

## 2018-11-16 DIAGNOSIS — N529 Male erectile dysfunction, unspecified: Secondary | ICD-10-CM

## 2018-11-16 DIAGNOSIS — I10 Essential (primary) hypertension: Secondary | ICD-10-CM | POA: Diagnosis not present

## 2018-11-16 MED ORDER — SILDENAFIL CITRATE 100 MG PO TABS: 100.0000 mg | ORAL_TABLET | Freq: Every day | ORAL | 11 refills | Status: DC | PRN

## 2018-11-16 MED ORDER — FISH OIL 1000 MG PO CAPS: 1.0000 | ORAL_CAPSULE | Freq: Every morning | ORAL | Status: DC

## 2018-11-16 MED ORDER — HYDROCHLOROTHIAZIDE 12.5 MG PO TABS: 12.5000 mg | ORAL_TABLET | Freq: Every day | ORAL | 1 refills | Status: DC

## 2018-11-16 MED ORDER — QUINAPRIL HCL 20 MG PO TABS: ORAL_TABLET | ORAL | 1 refills | Status: DC

## 2018-11-16 NOTE — Progress Notes (Signed)
Date:  11/16/2018   Name:  Ryan Parks   DOB:  1960/08/12   MRN:  [id number]   Chief Complaint: Hypertension  Hypertension This is a chronic problem. The current episode started more than 1 year ago. The problem is unchanged. The problem is controlled. Pertinent negatives include no anxiety, blurred vision, chest pain, headaches, malaise/fatigue, neck pain, orthopnea, palpitations, peripheral edema, PND, shortness of breath or sweats. There are no associated agents to hypertension. Risk factors for coronary artery disease include obesity and male gender. Past treatments include ACE inhibitors and diuretics. The current treatment provides moderate improvement. There are no compliance problems.  There is no history of angina, kidney disease, CAD/MI, CVA, heart failure, left ventricular hypertrophy, PVD or retinopathy. There is no history of chronic renal disease, a hypertension causing med or renovascular disease.  Hyperlipidemia This is a chronic problem. The current episode started more than 1 month ago. The problem is controlled. Recent lipid tests were reviewed and are normal. He has no history of chronic renal disease. Factors aggravating his hyperlipidemia include thiazides and fatty foods. Pertinent negatives include no chest pain, focal sensory loss, focal weakness, leg pain, myalgias or shortness of breath. There are no compliance problems.   Erectile Dysfunction This is a chronic problem. The current episode started more than 1 year ago. The problem has been waxing and waning since onset. The nature of his difficulty is achieving erection. He reports no anxiety. Irritative symptoms do not include frequency or urgency. Obstructive symptoms do not include an intermittent stream or straining. Pertinent negatives include no chills, dysuria or hematuria.    Review of Systems  Constitutional: Negative for chills, fever and malaise/fatigue.  HENT: Negative for drooling, ear discharge, ear  pain and sore throat.   Eyes: Negative for blurred vision.  Respiratory: Negative for cough, shortness of breath and wheezing.   Cardiovascular: Negative for chest pain, palpitations, orthopnea, leg swelling and PND.  Gastrointestinal: Negative for abdominal pain, blood in stool, constipation, diarrhea and nausea.  Endocrine: Negative for polydipsia.  Genitourinary: Negative for dysuria, frequency, hematuria and urgency.  Musculoskeletal: Negative for back pain, myalgias and neck pain.  Skin: Negative for rash.  Allergic/Immunologic: Negative for environmental allergies.  Neurological: Negative for dizziness, focal weakness and headaches.  Hematological: Does not bruise/bleed easily.  Psychiatric/Behavioral: Negative for suicidal ideas. The patient is not nervous/anxious.     Patient Active Problem List   Diagnosis Date Noted  . Polyp of sigmoid colon   . Benign neoplasm of cecum   . Erectile dysfunction due to arterial insufficiency 03/13/2016  . Erectile dysfunction 03/13/2016  . Personal history of colonic polyps   . Benign neoplasm of ascending colon   . Benign neoplasm of descending colon   . Benign neoplasm of sigmoid colon   . Essential hypertension 11/01/2014    No Known Allergies  Past Surgical History:  Procedure Laterality Date  . COLONOSCOPY  2011   [facility]- polyps- repeat in 7  . COLONOSCOPY WITH PROPOFOL N/A 05/31/2015   Procedure: COLONOSCOPY WITH PROPOFOL;  Surgeon: Lucilla Lame, MD;  Location: [place];  Service: Endoscopy;  Laterality: N/A;  . COLONOSCOPY WITH PROPOFOL N/A 07/01/2018   Procedure: COLONOSCOPY WITH BIOPSY;  Surgeon: Lucilla Lame, MD;  Location: [place];  Service: Endoscopy;  Laterality: N/A;  . POLYPECTOMY  05/31/2015   Procedure: POLYPECTOMY;  Surgeon: Lucilla Lame, MD;  Location: [place];  Service: Endoscopy;;  . POLYPECTOMY N/A 07/01/2018   Procedure:  POLYPECTOMY;  Surgeon: Lucilla Lame, MD;  Location:  [place];  Service: Endoscopy;  Laterality: N/A;    Social History   Tobacco Use  . Smoking status: Never Smoker  . Smokeless tobacco: Never Used  Substance Use Topics  . Alcohol use: Yes    Alcohol/week: 0.0 standard drinks    Comment: 1-2 drinks/month  . Drug use: No     Medication list has been reviewed and updated.  Current Meds  Medication Sig  . aspirin 81 MG tablet Take 1 tablet (81 mg total) by mouth daily.  . hydrochlorothiazide (HYDRODIURIL) 12.5 MG tablet TAKE 1 TABLET DAILY  . Omega-3 Fatty Acids (FISH OIL) 1000 MG CAPS Take by mouth.  . quinapril (ACCUPRIL) 20 MG tablet One a day    PHQ 2/9 Scores 11/16/2018 05/31/2017  PHQ - 2 Score 0 0  PHQ- 9 Score 0 0    BP Readings from Last 3 Encounters:  11/16/18 110/70  07/01/18 118/85  05/09/18 120/80    Physical Exam Vitals signs and nursing note reviewed.  HENT:     Head: Normocephalic.     Right Ear: Tympanic membrane, ear canal and external ear normal.     Left Ear: Tympanic membrane, ear canal and external ear normal.     Nose: Nose normal.  Eyes:     General: No scleral icterus.       Right eye: No discharge.        Left eye: No discharge.     Conjunctiva/sclera: Conjunctivae normal.     Pupils: Pupils are equal, round, and reactive to light.  Neck:     Musculoskeletal: Normal range of motion and neck supple.     Thyroid: No thyromegaly.     Vascular: No JVD.     Trachea: No tracheal deviation.  Cardiovascular:     Rate and Rhythm: Normal rate and regular rhythm.     Heart sounds: Normal heart sounds, S1 normal and S2 normal. Heart sounds not distant. No murmur. No systolic murmur. No diastolic murmur. No friction rub. No gallop. No S3 or S4 sounds.   Pulmonary:     Effort: No respiratory distress.     Breath sounds: Normal breath sounds. No wheezing, rhonchi or rales.  Abdominal:     General: Bowel sounds are normal.     Palpations: Abdomen is soft. There is no mass.     Tenderness:  There is no abdominal tenderness. There is no guarding or rebound.  Musculoskeletal: Normal range of motion.        General: No tenderness.     Right lower leg: No edema.     Left lower leg: No edema.  Lymphadenopathy:     Cervical: No cervical adenopathy.  Skin:    General: Skin is warm.     Findings: No rash.  Neurological:     Mental Status: He is alert and oriented to person, place, and time.     Cranial Nerves: No cranial nerve deficit.     Deep Tendon Reflexes: Reflexes are normal and symmetric.     Wt Readings from Last 3 Encounters:  11/16/18 232 lb (105.2 kg)  07/01/18 224 lb (101.6 kg)  05/09/18 232 lb (105.2 kg)    BP 110/70   Pulse 80   Ht 5\' 9"  (1.753 m)   Wt 232 lb (105.2 kg)   BMI 34.26 kg/m   Assessment and Plan:  1. Erectile dysfunction, unspecified erectile dysfunction type Chronic.  Persistent.  Refill sildenafil 100 mg tablets 1 tablet by mouth as needed for erectile dysfunction. - sildenafil (VIAGRA) 100 MG tablet; Take 1 tablet (100 mg total) by mouth daily as needed for erectile dysfunction.  Dispense: 12 tablet; Refill: 11  2. Essential hypertension Chronic.  Controlled.  Continue hydrochlorothiazide 1 a day. - hydrochlorothiazide (HYDRODIURIL) 12.5 MG tablet; Take 1 tablet (12.5 mg total) by mouth daily.  Dispense: 90 tablet; Refill: 1 - quinapril (ACCUPRIL) 20 MG tablet; One a day  Dispense: 90 tablet; Refill: 1 - Renal Function Panel  3. Mixed hyperlipidemia Chronic.  Controlled.  Continue with diet control.  Will check lipid panel.  LDLs have been elevated in the past and have discussed the likelihood of perhaps starting medication if continues to be elevated. - Lipid panel

## 2018-11-16 NOTE — Patient Instructions (Signed)

## 2018-11-17 ENCOUNTER — Other Ambulatory Visit: Payer: Self-pay

## 2018-11-17 DIAGNOSIS — E782 Mixed hyperlipidemia: Secondary | ICD-10-CM

## 2018-11-17 LAB — RENAL FUNCTION PANEL
Albumin: 4.5 g/dL (ref 3.8–4.9)
BUN/Creatinine Ratio: 13 (ref 9–20)
BUN: 13 mg/dL (ref 6–24)
CO2: 20 mmol/L (ref 20–29)
Calcium: 9.6 mg/dL (ref 8.7–10.2)
Chloride: 104 mmol/L (ref 96–106)
Creatinine, Ser: 1 mg/dL (ref 0.76–1.27)
GFR calc Af Amer: 95 mL/min/{1.73_m2} (ref >59)
GFR calc non Af Amer: 82 mL/min/{1.73_m2} (ref >59)
Glucose: 107 mg/dL — ABNORMAL HIGH (ref 65–99)
Phosphorus: 3.7 mg/dL (ref 2.8–4.1)
Potassium: 4.4 mmol/L (ref 3.5–5.2)
Sodium: 144 mmol/L (ref 134–144)

## 2018-11-17 LAB — LIPID PANEL
Chol/HDL Ratio: 4.6 ratio (ref 0.0–5.0)
Cholesterol, Total: 254 mg/dL — ABNORMAL HIGH (ref 100–199)
HDL: 55 mg/dL (ref >39)
LDL Calculated: 177 mg/dL — ABNORMAL HIGH (ref 0–99)
Triglycerides: 109 mg/dL (ref 0–149)
VLDL Cholesterol Cal: 22 mg/dL (ref 5–40)

## 2018-11-17 MED ORDER — ATORVASTATIN CALCIUM 10 MG PO TABS: 10.0000 mg | ORAL_TABLET | Freq: Every day | ORAL | 1 refills | Status: DC

## 2018-11-17 NOTE — Progress Notes (Unsigned)
Sent in lipitor ?

## 2019-01-31 ENCOUNTER — Other Ambulatory Visit: Payer: Self-pay | Admitting: Family Medicine

## 2019-01-31 DIAGNOSIS — E782 Mixed hyperlipidemia: Secondary | ICD-10-CM

## 2019-04-27 ENCOUNTER — Other Ambulatory Visit: Payer: Self-pay

## 2019-04-27 ENCOUNTER — Encounter: Payer: Self-pay | Admitting: Emergency Medicine

## 2019-04-27 ENCOUNTER — Ambulatory Visit
Admission: EM | Admit: 2019-04-27 | Discharge: 2019-04-27 | Disposition: A | Discharge status: Home / Self Care | Payer: BLUE CROSS/BLUE SHIELD | Attending: Emergency Medicine | Admitting: Emergency Medicine

## 2019-04-27 ENCOUNTER — Ambulatory Visit (INDEPENDENT_AMBULATORY_CARE_PROVIDER_SITE_OTHER): Payer: BLUE CROSS/BLUE SHIELD

## 2019-04-27 DIAGNOSIS — M25521 Pain in right elbow: Secondary | ICD-10-CM

## 2019-04-27 DIAGNOSIS — W010XXA Fall on same level from slipping, tripping and stumbling without subsequent striking against object, initial encounter: Secondary | ICD-10-CM | POA: Diagnosis not present

## 2019-04-27 DIAGNOSIS — S52044A Nondisplaced fracture of coronoid process of right ulna, initial encounter for closed fracture: Secondary | ICD-10-CM | POA: Diagnosis not present

## 2019-04-27 MED ORDER — IBUPROFEN 800 MG PO TABS: 800.0000 mg | ORAL_TABLET | Freq: Three times a day (TID) | ORAL | 0 refills | Status: DC | PRN

## 2019-04-27 MED ORDER — TRAMADOL HCL 50 MG PO TABS: 50.0000 mg | ORAL_TABLET | Freq: Three times a day (TID) | ORAL | 0 refills | Status: DC | PRN

## 2019-04-27 NOTE — Discharge Instructions (Addendum)
Ice. Elevate. Keep in splint. Rest. Drink plenty of fluids.  Take pain medication as needed.  Continue ibuprofen or tylenol.  Follow-up with orthopedic this week as discussed.  See above to call today to schedule.  Follow up with your primary care physician this week as needed. Return to Urgent care for new or worsening concerns.

## 2019-04-27 NOTE — ED Provider Notes (Signed)
[facility] ____________________________________________  Time seen: Approximately 1:05 PM  I have reviewed the triage vital signs and the nursing notes.   HISTORY  Chief Complaint Elbow Pain (right) and Fall  HPI Ryan Parks is a 58 y.o. male presenting for evaluation of right elbow pain.  Reports this morning he went out the back door and slipped on the deck causing him to fall and catch himself with his right arm extended.  Reports he immediately had right elbow pain and has continued since.  Reports he is unable to fully extend his arm or rotate.  Denies other pain to his arm besides "soreness ".  States the rest of his arm feels okay.  Denies other injuries.  Denies paresthesias or decreased sensation.  Denies change in hand grip strength.  Reports to take 800 mg ibuprofen which helps some this morning.  Denies other aggravating alleviating factors.  Reports previously followed [facility].    Past Medical History:  Diagnosis Date  . Erectile dysfunction   . Hypertension     Patient Active Problem List   Diagnosis Date Noted  . Polyp of sigmoid colon   . Benign neoplasm of cecum   . Erectile dysfunction due to arterial insufficiency 03/13/2016  . Erectile dysfunction 03/13/2016  . Personal history of colonic polyps   . Benign neoplasm of ascending colon   . Benign neoplasm of descending colon   . Benign neoplasm of sigmoid colon   . Essential hypertension 11/01/2014    Past Surgical History:  Procedure Laterality Date  . COLONOSCOPY  2011   [facility]- polyps- repeat in 7  . COLONOSCOPY WITH PROPOFOL N/A 05/31/2015   Procedure: COLONOSCOPY WITH PROPOFOL;  Surgeon: Lucilla Lame, MD;  Location: [facility];  Service: Endoscopy;  Laterality: N/A;  . COLONOSCOPY WITH PROPOFOL N/A 07/01/2018   Procedure: COLONOSCOPY WITH BIOPSY;  Surgeon: Lucilla Lame, MD;  Location: [place];  Service: Endoscopy;  Laterality: N/A;  .  POLYPECTOMY  05/31/2015   Procedure: POLYPECTOMY;  Surgeon: Lucilla Lame, MD;  Location: [place];  Service: Endoscopy;;  . POLYPECTOMY N/A 07/01/2018   Procedure: POLYPECTOMY;  Surgeon: Lucilla Lame, MD;  Location: [place];  Service: Endoscopy;  Laterality: N/A;     No current facility-administered medications for this encounter.  Current Outpatient Medications:  .  aspirin 81 MG tablet, Take 1 tablet (81 mg total) by mouth daily., Disp: 30 tablet, Rfl: 11 .  atorvastatin (LIPITOR) 10 MG tablet, TAKE 1 TABLET BY MOUTH EVERY DAY, Disp: 30 tablet, Rfl: 0 .  hydrochlorothiazide (HYDRODIURIL) 12.5 MG tablet, Take 1 tablet (12.5 mg total) by mouth daily., Disp: 90 tablet, Rfl: 1 .  quinapril (ACCUPRIL) 20 MG tablet, One a day, Disp: 90 tablet, Rfl: 1 .  ibuprofen (ADVIL) 800 MG tablet, Take 1 tablet (800 mg total) by mouth every 8 (eight) hours as needed for moderate pain., Disp: 21 tablet, Rfl: 0 .  sildenafil (VIAGRA) 100 MG tablet, Take 1 tablet (100 mg total) by mouth daily as needed for erectile dysfunction., Disp: 12 tablet, Rfl: 11 .  traMADol (ULTRAM) 50 MG tablet, Take 1 tablet (50 mg total) by mouth every 8 (eight) hours as needed for moderate pain or severe pain., Disp: 15 tablet, Rfl: 0  Allergies Patient has no known allergies.  Family History  Problem Relation Age of Onset  . Healthy Mother   . Healthy Father     Social History Social History   Tobacco Use  .  Smoking status: Never Smoker  . Smokeless tobacco: Never Used  Substance Use Topics  . Alcohol use: Yes    Alcohol/week: 0.0 standard drinks    Comment: 1-2 drinks/month  . Drug use: No    Review of Systems Constitutional: No fever ENT: No sore throat. Cardiovascular: Denies chest pain. Respiratory: Denies shortness of breath. Musculoskeletal: Positive right elbow pain.  Skin: Negative for rash. Neurological: Negative for focal weakness or numbness.     ____________________________________________   PHYSICAL EXAM:  VITAL SIGNS: ED Triage Vitals  Enc Vitals Group     BP 04/27/19 1118 (!) 147/92     Pulse Rate 04/27/19 1118 (!) 54     Resp 04/27/19 1118 18     Temp 04/27/19 1118 98.7 F (37.1 C)     Temp Source 04/27/19 1118 Oral     SpO2 04/27/19 1118 99 %     Weight 04/27/19 1115 225 lb (102.1 kg)     Height 04/27/19 1115 5' 9.5" (1.765 m)     Head Circumference --      Peak Flow --      Pain Score 04/27/19 1115 6     Pain Loc --      Pain Edu? --      Excl. in [place]? --     Constitutional: Alert and oriented. Well appearing and in no acute distress. Eyes: Conjunctivae are normal.  ENT      Head: Normocephalic and atraumatic. Cardiovascular: Normal heart rate.  Good peripheral circulation. Respiratory: Normal respiratory effort without tachypnea nor retractions.  Musculoskeletal: Steady gait.  Bilateral hand grip strong and equal.  Bilateral distal radial pulses equal and easily palpated. Except: Posterior elbow diffuse moderate tenderness direct palpation minimal localized swelling, no anterior elbow tenderness, right upper extremity otherwise nontender, and able to fully extend or flex to right elbow, pain and difficulty with supination and pronation, no pain to right shoulder or right forearm. Neurologic:  Normal speech and language. Speech is normal. No gait instability.  Skin:  Skin is warm, dry and intact. No rash noted. Psychiatric: Mood and affect are normal. Speech and behavior are normal. Patient exhibits appropriate insight and judgment   ___________________________________________   LABS (all labs ordered are listed, but only abnormal results are displayed)  Labs Reviewed - No data to display  RADIOLOGY  DG Elbow Complete Right  Result Date: 04/27/2019 CLINICAL DATA:  Fall, pain.  Unable to straighten right elbow. EXAM: RIGHT ELBOW - COMPLETE 3+ VIEW COMPARISON:  None. FINDINGS: Fracture noted through the  proximal ulna in the region of the coronoid process. No other fracture visualized. No subluxation or dislocation. Joint effusion present. IMPRESSION: Right ulnar coronoid process fracture, nondisplaced. Associated right elbow joint effusion. Electronically Signed   By: Rolm Baptise M.D.   On: 04/27/2019 11:40   ____________________________________________   PROCEDURES Procedures     INITIAL IMPRESSION / ASSESSMENT AND PLAN / ED COURSE  Pertinent labs & imaging results that were available during my care of the patient were reviewed by me and considered in my medical decision making (see chart for details).  Well-appearing patient.  No acute distress.  Right elbow pain post mechanical injury.  Right elbow x-ray as above, right ulnar coronoid process fracture, nondisplaced.  Right posterior elbow arm OCL splint applied and sling given.  Directed follow-up with orthopedic in 3 to 4 days.  Ice, elevate, rest and keep in splint.  800 mg ibuprofen and as needed tramadol sent. Discussed indication, risks  and benefits of medications with patient.   Discussed follow up and return parameters including no resolution or any worsening concerns. Patient verbalized understanding and agreed to plan.   [place] controlled substance database reviewed, no recent controlled substances documented.  ____________________________________________   FINAL CLINICAL IMPRESSION(S) / ED DIAGNOSES  Final diagnoses:  Closed nondisplaced fracture of coronoid process of right ulna, initial encounter  Right elbow pain     ED Discharge Orders         Ordered    traMADol (ULTRAM) 50 MG tablet  Every 8 hours PRN     04/27/19 1159    ibuprofen (ADVIL) 800 MG tablet  Every 8 hours PRN     04/27/19 1230           Note: This dictation was prepared with Dragon dictation along with smaller phrase technology. Any transcriptional errors that result from this process are unintentional.         Marylene Land, NP 04/27/19 1441

## 2019-04-27 NOTE — ED Triage Notes (Signed)
Pt states that he fell out of his back door this morning and is having right elbow pain.

## 2019-05-01 ENCOUNTER — Telehealth: Payer: Self-pay

## 2019-05-01 NOTE — Telephone Encounter (Signed)
Called and scheduled pt for in the morning 05/02/2019 @ 10:00 in [place] with Vance Peper- pt notified/ ortho

## 2019-05-05 ENCOUNTER — Ambulatory Visit: Payer: BLUE CROSS/BLUE SHIELD | Admitting: Family Medicine

## 2019-05-15 ENCOUNTER — Other Ambulatory Visit: Payer: Self-pay | Admitting: Family Medicine

## 2019-05-15 ENCOUNTER — Ambulatory Visit: Payer: Self-pay | Admitting: Family Medicine

## 2019-05-15 DIAGNOSIS — I10 Essential (primary) hypertension: Secondary | ICD-10-CM

## 2019-05-22 ENCOUNTER — Encounter: Payer: Self-pay | Admitting: Family Medicine

## 2019-05-22 ENCOUNTER — Ambulatory Visit: Payer: Managed Care, Other (non HMO) | Admitting: Family Medicine

## 2019-05-22 ENCOUNTER — Other Ambulatory Visit: Payer: Self-pay

## 2019-05-22 VITALS — BP 130/80 | HR 64 | Ht 69.5 in | Wt 238.0 lb

## 2019-05-22 DIAGNOSIS — N529 Male erectile dysfunction, unspecified: Secondary | ICD-10-CM | POA: Diagnosis not present

## 2019-05-22 DIAGNOSIS — E782 Mixed hyperlipidemia: Secondary | ICD-10-CM

## 2019-05-22 DIAGNOSIS — Z23 Encounter for immunization: Secondary | ICD-10-CM | POA: Diagnosis not present

## 2019-05-22 DIAGNOSIS — I10 Essential (primary) hypertension: Secondary | ICD-10-CM

## 2019-05-22 MED ORDER — ATORVASTATIN CALCIUM 10 MG PO TABS: 10.0000 mg | ORAL_TABLET | Freq: Every day | ORAL | 1 refills | Status: DC

## 2019-05-22 MED ORDER — QUINAPRIL HCL 20 MG PO TABS: ORAL_TABLET | ORAL | 0 refills | Status: DC

## 2019-05-22 MED ORDER — SILDENAFIL CITRATE 100 MG PO TABS: 100.0000 mg | ORAL_TABLET | Freq: Every day | ORAL | 11 refills | Status: DC | PRN

## 2019-05-22 MED ORDER — HYDROCHLOROTHIAZIDE 12.5 MG PO TABS: 12.5000 mg | ORAL_TABLET | Freq: Every day | ORAL | 1 refills | Status: DC

## 2019-05-22 NOTE — Progress Notes (Signed)
Date:  05/22/2019   Name:  Ryan Parks   DOB:  03/24/1961   MRN:  [id number]   Chief Complaint: Hypertension, Hyperlipidemia, and Flu Vaccine  Hypertension This is a chronic problem. The current episode started more than 1 year ago. The problem has been gradually improving since onset. The problem is controlled. Pertinent negatives include no anxiety, blurred vision, chest pain, headaches, malaise/fatigue, neck pain, orthopnea, palpitations, peripheral edema, PND, shortness of breath or sweats. There are no associated agents to hypertension. Risk factors for coronary artery disease include dyslipidemia, male gender, obesity and post-menopausal state. Past treatments include ACE inhibitors and diuretics. The current treatment provides moderate improvement. There are no compliance problems.  There is no history of angina, kidney disease, CAD/MI, CVA, heart failure, left ventricular hypertrophy, PVD or retinopathy. There is no history of chronic renal disease, a hypertension causing med or renovascular disease.  Hyperlipidemia This is a chronic problem. The current episode started more than 1 year ago. The problem is controlled. Recent lipid tests were reviewed and are normal. He has no history of chronic renal disease, diabetes, hypothyroidism, liver disease, obesity or nephrotic syndrome. Factors aggravating his hyperlipidemia include thiazides. Pertinent negatives include no chest pain, focal sensory loss, focal weakness, myalgias or shortness of breath. Current antihyperlipidemic treatment includes statins. The current treatment provides mild improvement of lipids. There are no compliance problems.  Risk factors for coronary artery disease include hypertension and obesity.  Erectile Dysfunction This is a chronic problem. The current episode started more than 1 year ago. The problem has been waxing and waning since onset. The nature of his difficulty is achieving erection. He reports no anxiety,  decreased libido or performance anxiety. Irritative symptoms do not include frequency, nocturia or urgency. Obstructive symptoms do not include dribbling, incomplete emptying, an intermittent stream, a slower stream, straining or a weak stream. Pertinent negatives include no chills, dysuria or hematuria. The treatment provided moderate relief.    Lab Results  Component Value Date   CREATININE 1.00 11/16/2018   BUN 13 11/16/2018   NA 144 11/16/2018   K 4.4 11/16/2018   CL 104 11/16/2018   CO2 20 11/16/2018   Lab Results  Component Value Date   CHOL 254 (H) 11/16/2018   HDL 55 11/16/2018   LDLCALC 177 (H) 11/16/2018   TRIG 109 11/16/2018   CHOLHDL 4.6 11/16/2018   No results found for: TSH No results found for: HGBA1C   Review of Systems  Constitutional: Negative for chills, fever and malaise/fatigue.  HENT: Negative for drooling, ear discharge, ear pain, postnasal drip and sore throat.   Eyes: Negative for blurred vision.  Respiratory: Negative for cough, shortness of breath and wheezing.   Cardiovascular: Negative for chest pain, palpitations, orthopnea, leg swelling and PND.  Gastrointestinal: Negative for abdominal pain, blood in stool, constipation, diarrhea and nausea.  Endocrine: Negative for polydipsia.  Genitourinary: Negative for decreased libido, dysuria, frequency, hematuria, incomplete emptying, nocturia and urgency.  Musculoskeletal: Negative for back pain, myalgias and neck pain.  Skin: Negative for rash.  Allergic/Immunologic: Negative for environmental allergies.  Neurological: Negative for dizziness, focal weakness and headaches.  Hematological: Does not bruise/bleed easily.  Psychiatric/Behavioral: Negative for suicidal ideas. The patient is not nervous/anxious.     Patient Active Problem List   Diagnosis Date Noted  . Polyp of sigmoid colon   . Benign neoplasm of cecum   . Erectile dysfunction due to arterial insufficiency 03/13/2016  . Erectile  dysfunction 03/13/2016  .  Personal history of colonic polyps   . Benign neoplasm of ascending colon   . Benign neoplasm of descending colon   . Benign neoplasm of sigmoid colon   . Essential hypertension 11/01/2014    No Known Allergies  Past Surgical History:  Procedure Laterality Date  . COLONOSCOPY  2011   [facility]- polyps- repeat in 7  . COLONOSCOPY WITH PROPOFOL N/A 05/31/2015   Procedure: COLONOSCOPY WITH PROPOFOL;  Surgeon: Lucilla Lame, MD;  Location: [place];  Service: Endoscopy;  Laterality: N/A;  . COLONOSCOPY WITH PROPOFOL N/A 07/01/2018   Procedure: COLONOSCOPY WITH BIOPSY;  Surgeon: Lucilla Lame, MD;  Location: [place];  Service: Endoscopy;  Laterality: N/A;  . POLYPECTOMY  05/31/2015   Procedure: POLYPECTOMY;  Surgeon: Lucilla Lame, MD;  Location: [place];  Service: Endoscopy;;  . POLYPECTOMY N/A 07/01/2018   Procedure: POLYPECTOMY;  Surgeon: Lucilla Lame, MD;  Location: [place];  Service: Endoscopy;  Laterality: N/A;    Social History   Tobacco Use  . Smoking status: Never Smoker  . Smokeless tobacco: Never Used  Substance Use Topics  . Alcohol use: Yes    Alcohol/week: 0.0 standard drinks    Comment: 1-2 drinks/month  . Drug use: No     Medication list has been reviewed and updated.  Current Meds  Medication Sig  . aspirin 81 MG tablet Take 1 tablet (81 mg total) by mouth daily.  Marland Kitchen atorvastatin (LIPITOR) 10 MG tablet TAKE 1 TABLET BY MOUTH EVERY DAY  . hydrochlorothiazide (HYDRODIURIL) 12.5 MG tablet Take 1 tablet (12.5 mg total) by mouth daily.  Marland Kitchen ibuprofen (ADVIL) 800 MG tablet Take 1 tablet (800 mg total) by mouth every 8 (eight) hours as needed for moderate pain.  Marland Kitchen quinapril (ACCUPRIL) 20 MG tablet TAKE 1 TABLET DAILY  . sildenafil (VIAGRA) 100 MG tablet Take 1 tablet (100 mg total) by mouth daily as needed for erectile dysfunction.  . traMADol (ULTRAM) 50 MG tablet Take 1 tablet (50 mg total) by mouth  every 8 (eight) hours as needed for moderate pain or severe pain.    PHQ 2/9 Scores 05/22/2019 11/16/2018 05/31/2017  PHQ - 2 Score 0 0 0  PHQ- 9 Score 0 0 0    BP Readings from Last 3 Encounters:  05/22/19 130/80  04/27/19 (!) 147/92  11/16/18 110/70    Physical Exam Vitals and nursing note reviewed.  HENT:     Head: Normocephalic.     Right Ear: Tympanic membrane, ear canal and external ear normal. There is no impacted cerumen.     Left Ear: Tympanic membrane, ear canal and external ear normal. There is no impacted cerumen.     Nose: Nose normal. No congestion or rhinorrhea.     Mouth/Throat:     Mouth: Mucous membranes are moist.  Eyes:     General: No scleral icterus.       Right eye: No discharge.        Left eye: No discharge.     Conjunctiva/sclera: Conjunctivae normal.     Pupils: Pupils are equal, round, and reactive to light.  Neck:     Thyroid: No thyromegaly.     Vascular: No JVD.     Trachea: No tracheal deviation.  Cardiovascular:     Rate and Rhythm: Normal rate and regular rhythm.     Heart sounds: Normal heart sounds. No murmur. No friction rub. No gallop.   Pulmonary:     Effort: No respiratory  distress.     Breath sounds: Normal breath sounds. No wheezing, rhonchi or rales.  Chest:     Chest wall: No tenderness.  Abdominal:     General: Bowel sounds are normal.     Palpations: Abdomen is soft. There is no mass.     Tenderness: There is no abdominal tenderness. There is no guarding or rebound.  Musculoskeletal:        General: No tenderness. Normal range of motion.     Cervical back: Normal range of motion and neck supple.  Lymphadenopathy:     Cervical: No cervical adenopathy.  Skin:    General: Skin is warm.     Findings: No rash.  Neurological:     Mental Status: He is alert and oriented to person, place, and time.     Cranial Nerves: No cranial nerve deficit.     Deep Tendon Reflexes: Reflexes are normal and symmetric.     Wt Readings from  Last 3 Encounters:  05/22/19 238 lb (108 kg)  04/27/19 225 lb (102.1 kg)  11/16/18 232 lb (105.2 kg)    BP 130/80   Pulse 64   Ht 5' 9.5" (1.765 m)   Wt 238 lb (108 kg)   BMI 34.64 kg/m   Assessment and Plan: 1. Essential hypertension Chronic.  Controlled.  Stable.  Continue Accupril 20 mg once a day and hydrochlorothiazide 12.5 mg once a day will check renal function panel.  Will recheck blood pressure in 6 months. - quinapril (ACCUPRIL) 20 MG tablet; TAKE 1 TABLET DAILY  Dispense: 90 tablet; Refill: 0 - hydrochlorothiazide (HYDRODIURIL) 12.5 MG tablet; Take 1 tablet (12.5 mg total) by mouth daily.  Dispense: 90 tablet; Refill: 1 - Renal Function Panel  2. Mixed hyperlipidemia Chronic.  Controlled.  Stable.  Continue atorvastatin 10 mg once a day.  Will check lipid panel.  We will recheck patient in 6 months. - atorvastatin (LIPITOR) 10 MG tablet; Take 1 tablet (10 mg total) by mouth daily.  Dispense: 90 tablet; Refill: 1 - Lipid Panel With LDL/HDL Ratio  3. Erectile dysfunction, unspecified erectile dysfunction type Patient with history of erectile dysfunction is doing well on current dosing of sildenafil will continue as tolerated. - sildenafil (VIAGRA) 100 MG tablet; Take 1 tablet (100 mg total) by mouth daily as needed for erectile dysfunction.  Dispense: 12 tablet; Refill: 11  4. Influenza vaccine needed Discussed and administered. - Flu Vaccine QUAD 6+ mos PF IM (Fluarix Quad PF)

## 2019-05-22 NOTE — Patient Instructions (Signed)

## 2019-05-23 ENCOUNTER — Ambulatory Visit: Payer: Self-pay | Admitting: Family Medicine

## 2019-05-23 LAB — RENAL FUNCTION PANEL
Albumin: 4.8 g/dL (ref 3.8–4.9)
BUN/Creatinine Ratio: 15 (ref 9–20)
BUN: 14 mg/dL (ref 6–24)
CO2: 22 mmol/L (ref 20–29)
Calcium: 9.8 mg/dL (ref 8.7–10.2)
Chloride: 99 mmol/L (ref 96–106)
Creatinine, Ser: 0.96 mg/dL (ref 0.76–1.27)
GFR calc Af Amer: 100 mL/min/{1.73_m2} (ref >59)
GFR calc non Af Amer: 86 mL/min/{1.73_m2} (ref >59)
Glucose: 115 mg/dL — ABNORMAL HIGH (ref 65–99)
Phosphorus: 3.1 mg/dL (ref 2.8–4.1)
Potassium: 4.2 mmol/L (ref 3.5–5.2)
Sodium: 137 mmol/L (ref 134–144)

## 2019-05-23 LAB — LIPID PANEL WITH LDL/HDL RATIO
Cholesterol, Total: 175 mg/dL (ref 100–199)
HDL: 52 mg/dL (ref >39)
LDL Chol Calc (NIH): 107 mg/dL — ABNORMAL HIGH (ref 0–99)
LDL/HDL Ratio: 2.1 ratio (ref 0.0–3.6)
Triglycerides: 86 mg/dL (ref 0–149)
VLDL Cholesterol Cal: 16 mg/dL (ref 5–40)

## 2019-06-27 ENCOUNTER — Ambulatory Visit
Admission: EM | Admit: 2019-06-27 | Discharge: 2019-06-27 | Disposition: A | Discharge status: Home / Self Care | Payer: 59

## 2019-06-27 ENCOUNTER — Other Ambulatory Visit: Payer: Self-pay

## 2019-10-19 ENCOUNTER — Other Ambulatory Visit: Payer: Self-pay | Admitting: Family Medicine

## 2019-10-19 DIAGNOSIS — I10 Essential (primary) hypertension: Secondary | ICD-10-CM

## 2019-11-20 ENCOUNTER — Ambulatory Visit: Payer: Managed Care, Other (non HMO) | Admitting: Family Medicine

## 2019-12-11 ENCOUNTER — Ambulatory Visit: Payer: Managed Care, Other (non HMO) | Admitting: Family Medicine

## 2019-12-15 ENCOUNTER — Ambulatory Visit: Payer: 59 | Admitting: Family Medicine

## 2020-01-06 ENCOUNTER — Other Ambulatory Visit: Payer: Self-pay | Admitting: Family Medicine

## 2020-01-06 DIAGNOSIS — I10 Essential (primary) hypertension: Secondary | ICD-10-CM

## 2020-01-16 ENCOUNTER — Other Ambulatory Visit: Payer: Self-pay

## 2020-01-16 ENCOUNTER — Encounter: Payer: Self-pay | Admitting: Family Medicine

## 2020-01-16 ENCOUNTER — Ambulatory Visit (INDEPENDENT_AMBULATORY_CARE_PROVIDER_SITE_OTHER): Payer: 59 | Admitting: Family Medicine

## 2020-01-16 VITALS — BP 120/80 | HR 80 | Ht 69.5 in | Wt 238.0 lb

## 2020-01-16 DIAGNOSIS — Z23 Encounter for immunization: Secondary | ICD-10-CM | POA: Diagnosis not present

## 2020-01-16 DIAGNOSIS — M19031 Primary osteoarthritis, right wrist: Secondary | ICD-10-CM

## 2020-01-16 DIAGNOSIS — I10 Essential (primary) hypertension: Secondary | ICD-10-CM

## 2020-01-16 DIAGNOSIS — N529 Male erectile dysfunction, unspecified: Secondary | ICD-10-CM | POA: Diagnosis not present

## 2020-01-16 DIAGNOSIS — E782 Mixed hyperlipidemia: Secondary | ICD-10-CM

## 2020-01-16 MED ORDER — IBUPROFEN 800 MG PO TABS: 800.0000 mg | ORAL_TABLET | Freq: Three times a day (TID) | ORAL | 0 refills | Status: DC | PRN

## 2020-01-16 MED ORDER — SILDENAFIL CITRATE 100 MG PO TABS: 100.0000 mg | ORAL_TABLET | Freq: Every day | ORAL | 11 refills | Status: DC | PRN

## 2020-01-16 NOTE — Progress Notes (Signed)
Date:  01/16/2020   Name:  Ryan Parks   DOB:  Oct 20, 1960   MRN:  [id number]   Chief Complaint: Hypertension, Hyperlipidemia, Wrist Pain (ibuprofen refill), and Flu Vaccine  Hypertension This is a chronic problem. The current episode started more than 1 year ago. The problem has been gradually improving since onset. The problem is controlled. Pertinent negatives include no anxiety, blurred vision, chest pain, headaches, malaise/fatigue, neck pain, orthopnea, palpitations, peripheral edema, PND, shortness of breath or sweats. There are no associated agents to hypertension. There are no known risk factors for coronary artery disease. Past treatments include ACE inhibitors and diuretics. The current treatment provides moderate improvement. There are no compliance problems.  There is no history of angina, kidney disease, CAD/MI, CVA, heart failure, left ventricular hypertrophy, PVD or retinopathy. There is no history of chronic renal disease, a hypertension causing med or renovascular disease.  Hyperlipidemia This is a chronic problem. The current episode started more than 1 year ago. The problem is controlled. Recent lipid tests were reviewed and are normal. He has no history of chronic renal disease, diabetes, hypothyroidism, liver disease, obesity or nephrotic syndrome. Pertinent negatives include no chest pain, focal sensory loss, focal weakness, leg pain, myalgias or shortness of breath. Current antihyperlipidemic treatment includes statins. The current treatment provides no improvement of lipids. There are no compliance problems.  Risk factors for coronary artery disease include post-menopausal.  Wrist Pain  The pain is present in the right wrist. This is a chronic problem. The current episode started more than 1 year ago. There has been a history of trauma. The problem occurs daily. The problem has been gradually worsening. The quality of the pain is described as aching. The pain is moderate.  Pertinent negatives include no fever. He has tried NSAIDS for the symptoms. The treatment provided moderate relief. There is no history of diabetes.  Erectile Dysfunction This is a chronic problem. The problem has been waxing and waning since onset. The nature of his difficulty is achieving erection and maintaining erection. He reports no anxiety. Irritative symptoms do not include frequency or urgency. Pertinent negatives include no chills, dysuria or hematuria.    Lab Results  Component Value Date   CREATININE 0.96 05/22/2019   BUN 14 05/22/2019   NA 137 05/22/2019   K 4.2 05/22/2019   CL 99 05/22/2019   CO2 22 05/22/2019   Lab Results  Component Value Date   CHOL 175 05/22/2019   HDL 52 05/22/2019   LDLCALC 107 (H) 05/22/2019   TRIG 86 05/22/2019   CHOLHDL 4.6 11/16/2018   No results found for: TSH No results found for: HGBA1C No results found for: WBC, HGB, HCT, MCV, PLT No results found for: ALT, AST, GGT, ALKPHOS, BILITOT   Review of Systems  Constitutional: Negative for chills, fever and malaise/fatigue.  HENT: Negative for drooling, ear discharge, ear pain and sore throat.   Eyes: Negative for blurred vision.  Respiratory: Negative for cough, shortness of breath and wheezing.   Cardiovascular: Negative for chest pain, palpitations, orthopnea, leg swelling and PND.  Gastrointestinal: Negative for abdominal pain, blood in stool, constipation, diarrhea and nausea.  Endocrine: Negative for polydipsia.  Genitourinary: Negative for dysuria, frequency, hematuria and urgency.  Musculoskeletal: Negative for back pain, myalgias and neck pain.  Skin: Negative for rash.  Allergic/Immunologic: Negative for environmental allergies.  Neurological: Negative for dizziness, focal weakness and headaches.  Hematological: Does not bruise/bleed easily.  Psychiatric/Behavioral: Negative for suicidal ideas. The patient  is not nervous/anxious.     Patient Active Problem List   Diagnosis  Date Noted  . Polyp of sigmoid colon   . Benign neoplasm of cecum   . Erectile dysfunction due to arterial insufficiency 03/13/2016  . Erectile dysfunction 03/13/2016  . Personal history of colonic polyps   . Benign neoplasm of ascending colon   . Benign neoplasm of descending colon   . Benign neoplasm of sigmoid colon   . Essential hypertension 11/01/2014    No Known Allergies  Past Surgical History:  Procedure Laterality Date  . COLONOSCOPY  2011   [facility]- polyps- repeat in 7  . COLONOSCOPY WITH PROPOFOL N/A 05/31/2015   Procedure: COLONOSCOPY WITH PROPOFOL;  Surgeon: Lucilla Lame, MD;  Location: [place];  Service: Endoscopy;  Laterality: N/A;  . COLONOSCOPY WITH PROPOFOL N/A 07/01/2018   Procedure: COLONOSCOPY WITH BIOPSY;  Surgeon: Lucilla Lame, MD;  Location: [place];  Service: Endoscopy;  Laterality: N/A;  . POLYPECTOMY  05/31/2015   Procedure: POLYPECTOMY;  Surgeon: Lucilla Lame, MD;  Location: [place];  Service: Endoscopy;;  . POLYPECTOMY N/A 07/01/2018   Procedure: POLYPECTOMY;  Surgeon: Lucilla Lame, MD;  Location: [place];  Service: Endoscopy;  Laterality: N/A;    Social History   Tobacco Use  . Smoking status: Never Smoker  . Smokeless tobacco: Never Used  Vaping Use  . Vaping Use: Never used  Substance Use Topics  . Alcohol use: Yes    Alcohol/week: 0.0 standard drinks    Comment: 1-2 drinks/month  . Drug use: No     Medication list has been reviewed and updated.  Current Meds  Medication Sig  . aspirin 81 MG tablet Take 1 tablet (81 mg total) by mouth daily.  Marland Kitchen atorvastatin (LIPITOR) 10 MG tablet Take 1 tablet (10 mg total) by mouth daily.  . hydrochlorothiazide (HYDRODIURIL) 12.5 MG tablet TAKE 1 TABLET DAILY  . ibuprofen (ADVIL) 800 MG tablet Take 1 tablet (800 mg total) by mouth every 8 (eight) hours as needed for moderate pain.  Marland Kitchen quinapril (ACCUPRIL) 20 MG tablet TAKE 1 TABLET DAILY  . sildenafil  (VIAGRA) 100 MG tablet Take 1 tablet (100 mg total) by mouth daily as needed for erectile dysfunction.    PHQ 2/9 Scores 01/16/2020 05/22/2019 11/16/2018 05/31/2017  PHQ - 2 Score 0 0 0 0  PHQ- 9 Score 0 0 0 0    GAD 7 : Generalized Anxiety Score 01/16/2020 05/22/2019  Nervous, Anxious, on Edge 0 0  Control/stop worrying 0 0  Worry too much - different things 0 0  Trouble relaxing 0 0  Restless 0 0  Easily annoyed or irritable 0 0  Afraid - awful might happen 0 0  Total GAD 7 Score 0 0    BP Readings from Last 3 Encounters:  01/16/20 120/80  05/22/19 130/80  04/27/19 (!) 147/92    Physical Exam Vitals and nursing note reviewed.  HENT:     Head: Normocephalic.     Right Ear: Tympanic membrane, ear canal and external ear normal.     Left Ear: Tympanic membrane, ear canal and external ear normal.     Nose: Nose normal. No congestion or rhinorrhea.     Mouth/Throat:     Pharynx: No oropharyngeal exudate.  Eyes:     General: No scleral icterus.       Right eye: No discharge.        Left eye: No discharge.  Conjunctiva/sclera: Conjunctivae normal.     Pupils: Pupils are equal, round, and reactive to light.  Neck:     Thyroid: No thyromegaly.     Vascular: No JVD.     Trachea: No tracheal deviation.  Cardiovascular:     Rate and Rhythm: Normal rate and regular rhythm.     Heart sounds: Normal heart sounds. No murmur heard.  No friction rub. No gallop.   Pulmonary:     Effort: No respiratory distress.     Breath sounds: Normal breath sounds. No wheezing, rhonchi or rales.  Abdominal:     General: Bowel sounds are normal.     Palpations: Abdomen is soft. There is no mass.     Tenderness: There is no abdominal tenderness. There is no guarding or rebound.  Musculoskeletal:        General: No tenderness. Normal range of motion.     Cervical back: Normal range of motion and neck supple.  Lymphadenopathy:     Cervical: No cervical adenopathy.  Skin:    General: Skin is  warm.     Findings: No rash.  Neurological:     Mental Status: He is alert and oriented to person, place, and time.     Cranial Nerves: No cranial nerve deficit.     Deep Tendon Reflexes: Reflexes are normal and symmetric.     Wt Readings from Last 3 Encounters:  01/16/20 238 lb (108 kg)  05/22/19 238 lb (108 kg)  04/27/19 225 lb (102.1 kg)    BP 120/80   Pulse 80   Ht 5' 9.5" (1.765 m)   Wt 238 lb (108 kg)   BMI 34.64 kg/m   Assessment and Plan: 1. Essential hypertension Chronic.  Controlled.  Stable.  Blood pressure today is 120/80.  Continue hydrochlorothiazide 12.5 mg and quinapril 20 mg once a day.  Will check renal function panel. - Renal Function Panel  2. Mixed hyperlipidemia .  Controlled.  Stable.  Continue atorvastatin 10 mg once a day.  Patient's recently stopped omega-3 and would like to have lipid panel checked today. - Lipid Panel With LDL/HDL Ratio  3. Erectile dysfunction, unspecified erectile dysfunction type Chronic.  Controlled.  Stable.  Patient will continue with sildenafil 100 mg 1/2-1 as needed as needed - sildenafil (VIAGRA) 100 MG tablet; Take 1 tablet (100 mg total) by mouth daily as needed for erectile dysfunction.  Dispense: 12 tablet; Refill: 11  4. Arthritis of right wrist Chronic.  Controlled.  Stable.  Ibuprofen 800 mg 1 tablet every 8 hours as needed may call for refills if needed in the future. - ibuprofen (ADVIL) 800 MG tablet; Take 1 tablet (800 mg total) by mouth every 8 (eight) hours as needed for moderate pain.  Dispense: 90 tablet; Refill: 0

## 2020-01-17 ENCOUNTER — Other Ambulatory Visit: Payer: Self-pay | Admitting: Family Medicine

## 2020-01-17 DIAGNOSIS — I10 Essential (primary) hypertension: Secondary | ICD-10-CM

## 2020-01-17 DIAGNOSIS — E782 Mixed hyperlipidemia: Secondary | ICD-10-CM

## 2020-01-17 LAB — LIPID PANEL WITH LDL/HDL RATIO
Cholesterol, Total: 197 mg/dL (ref 100–199)
HDL: 51 mg/dL (ref >39)
LDL Chol Calc (NIH): 125 mg/dL — ABNORMAL HIGH (ref 0–99)
LDL/HDL Ratio: 2.5 ratio (ref 0.0–3.6)
Triglycerides: 115 mg/dL (ref 0–149)
VLDL Cholesterol Cal: 21 mg/dL (ref 5–40)

## 2020-01-17 LAB — RENAL FUNCTION PANEL
Albumin: 4.9 g/dL (ref 3.8–4.9)
BUN/Creatinine Ratio: 13 (ref 10–24)
BUN: 13 mg/dL (ref 8–27)
CO2: 25 mmol/L (ref 20–29)
Calcium: 9.6 mg/dL (ref 8.6–10.2)
Chloride: 99 mmol/L (ref 96–106)
Creatinine, Ser: 0.97 mg/dL (ref 0.76–1.27)
GFR calc Af Amer: 98 mL/min/{1.73_m2} (ref >59)
GFR calc non Af Amer: 84 mL/min/{1.73_m2} (ref >59)
Glucose: 97 mg/dL (ref 65–99)
Phosphorus: 3.3 mg/dL (ref 2.8–4.1)
Potassium: 3.9 mmol/L (ref 3.5–5.2)
Sodium: 140 mmol/L (ref 134–144)

## 2020-01-17 NOTE — Telephone Encounter (Signed)
Requested medications are due for refill today? Yes  Requested medications are on active medication list?  Yes  Last Refill:   10/19/2019  # 90 with no refills  Future visit scheduled?  Yes  Notes to Clinic:  Medication failed Rx refill protocol due to no labs within the past 180 days.  Last labs were performed on 05/22/2019.

## 2020-03-23 ENCOUNTER — Other Ambulatory Visit: Payer: Self-pay | Admitting: Family Medicine

## 2020-03-23 DIAGNOSIS — M19031 Primary osteoarthritis, right wrist: Secondary | ICD-10-CM

## 2020-03-23 NOTE — Telephone Encounter (Signed)
Requested medications are due for refill today yes  Requested medications are on the active medication list yes  Last refill 11/5  Last visit 01/2020  Notes to clinic Unclear if this was to be continued.

## 2020-06-02 IMAGING — CR DG ELBOW COMPLETE 3+V*R*
5 series · 5 of 5 positions shown · non-contrast
Comparison: None.

CLINICAL DATA: Fall, pain.  Unable to straighten right elbow.

EXAM:
RIGHT ELBOW - COMPLETE 3+ VIEW

[elbow ap (1 of 2)]
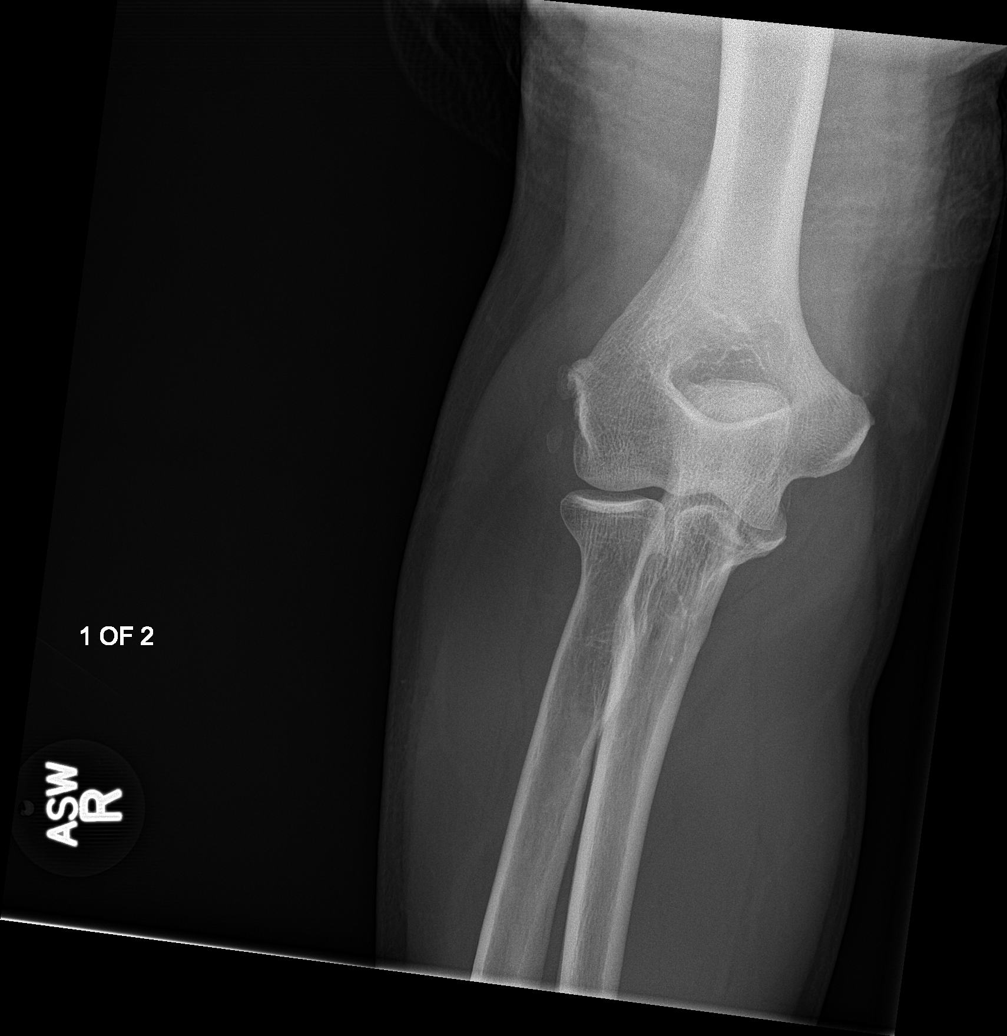

[elbow obl (1 of 2)]
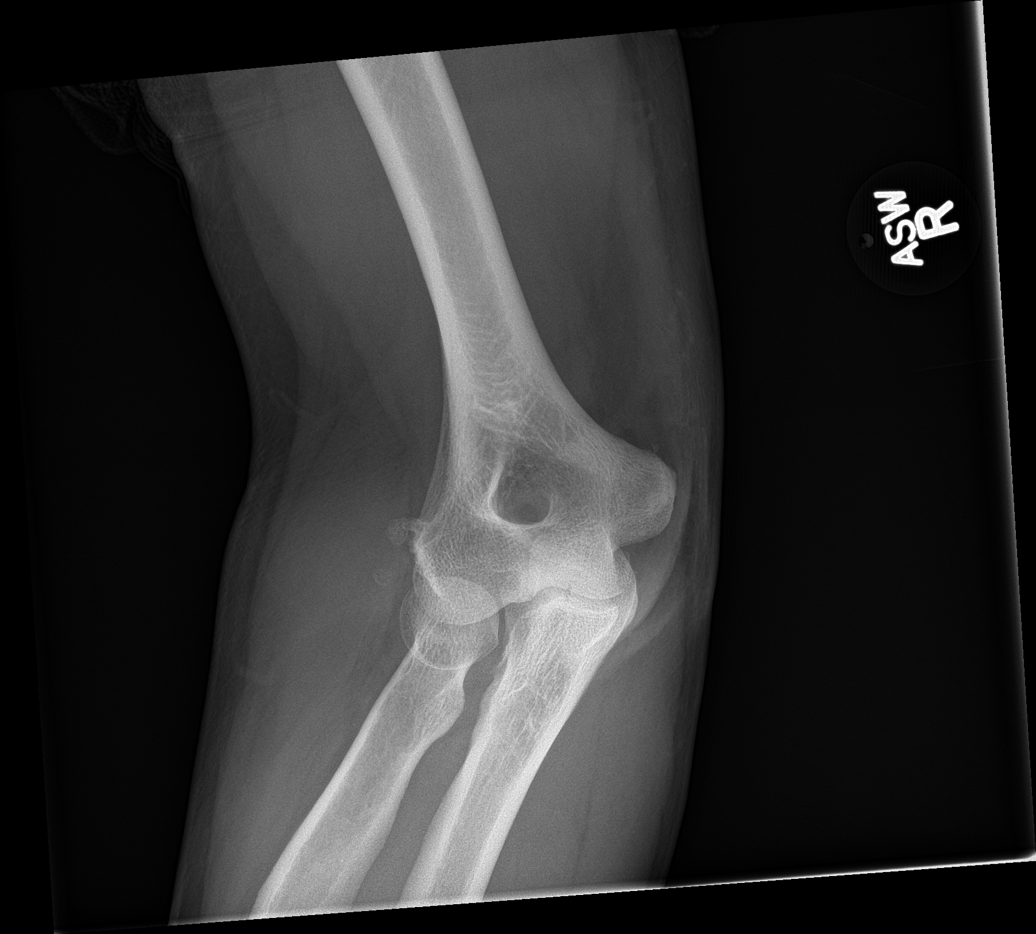

[elbow obl (2 of 2)]
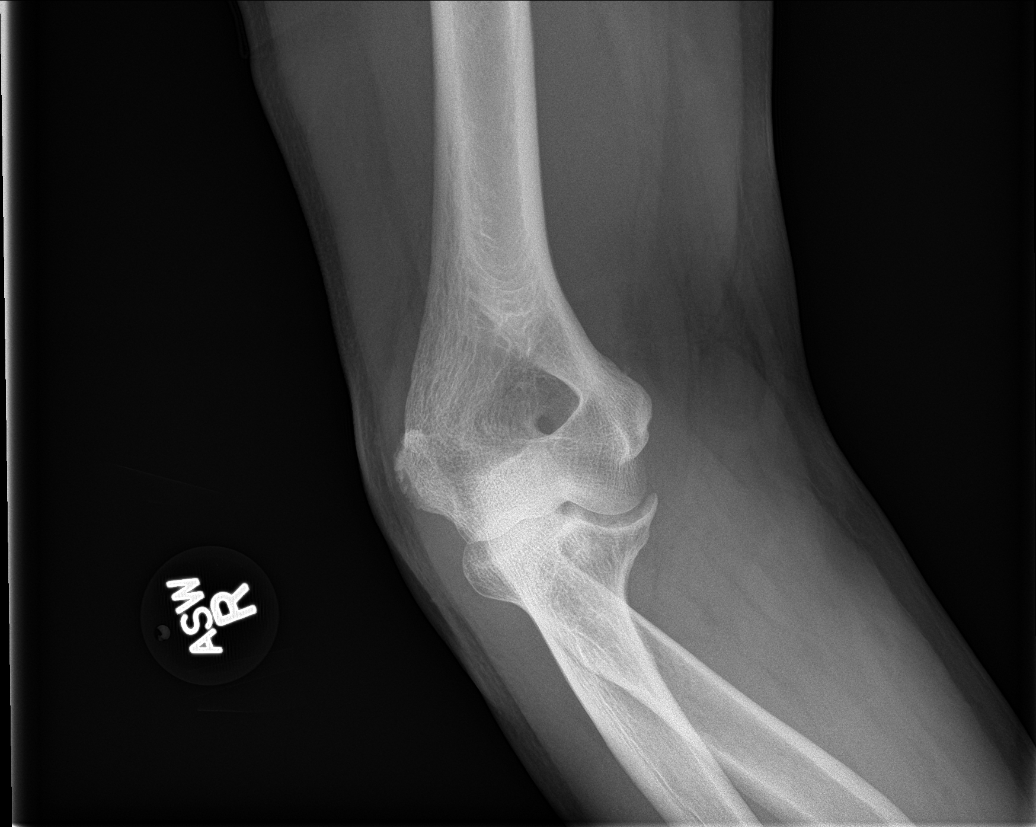

[elbow lat]
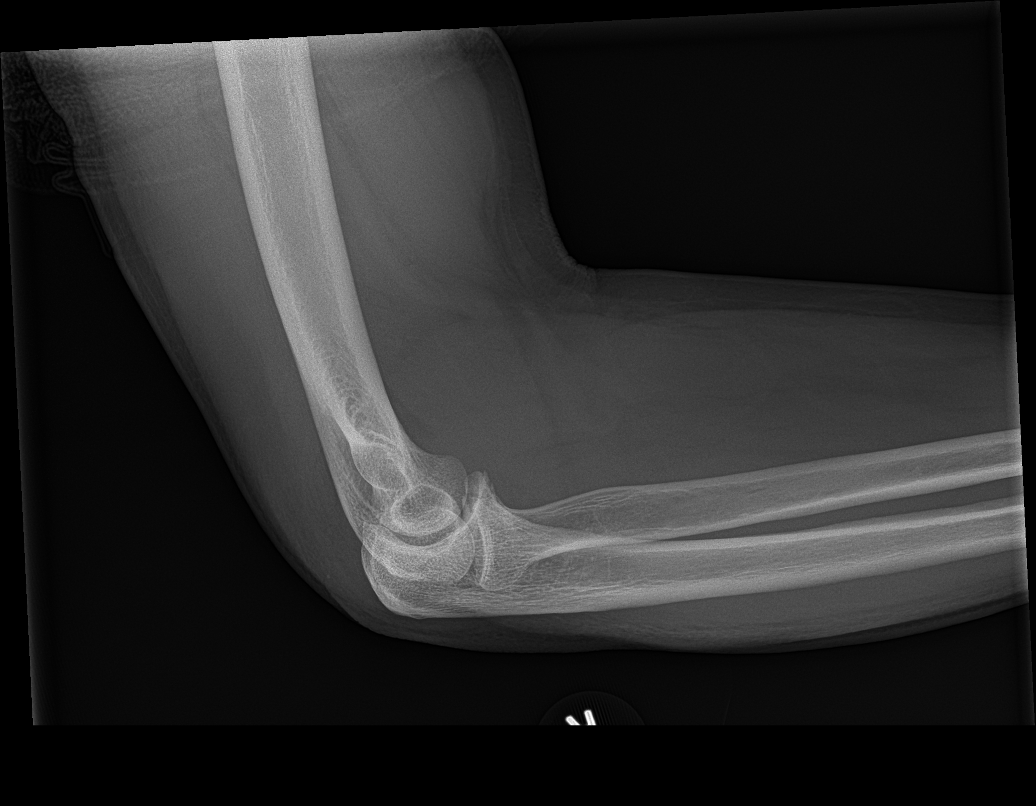

[elbow ap (2 of 2)]
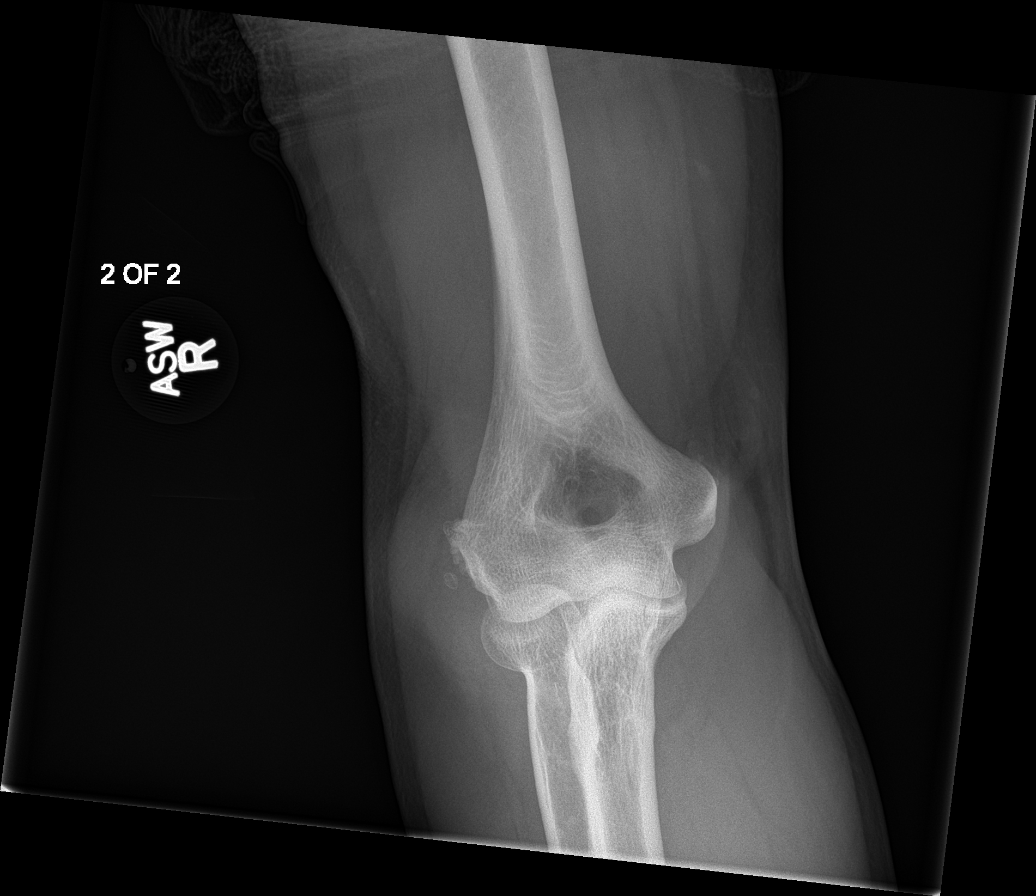

[5 of 5 positions shown; findings below may reference images not displayed]

FINDINGS: Fracture noted through the proximal ulna in the region of the
coronoid process. No other fracture visualized. No subluxation or
dislocation. Joint effusion present.
IMPRESSION: Right ulnar coronoid process fracture, nondisplaced. Associated
right elbow joint effusion.

## 2020-06-27 ENCOUNTER — Other Ambulatory Visit: Payer: Self-pay | Admitting: Family Medicine

## 2020-06-27 DIAGNOSIS — E782 Mixed hyperlipidemia: Secondary | ICD-10-CM

## 2020-07-15 ENCOUNTER — Other Ambulatory Visit: Payer: Self-pay

## 2020-07-15 ENCOUNTER — Other Ambulatory Visit: Payer: Self-pay | Admitting: Family Medicine

## 2020-07-15 DIAGNOSIS — I10 Essential (primary) hypertension: Secondary | ICD-10-CM

## 2020-07-15 MED ORDER — QUINAPRIL HCL 20 MG PO TABS: 20.0000 mg | ORAL_TABLET | Freq: Every day | ORAL | 0 refills | Status: DC

## 2020-07-16 ENCOUNTER — Ambulatory Visit: Payer: 59 | Admitting: Family Medicine

## 2020-07-31 ENCOUNTER — Other Ambulatory Visit: Payer: Self-pay

## 2020-07-31 ENCOUNTER — Ambulatory Visit: Payer: 59 | Admitting: Family Medicine

## 2020-07-31 ENCOUNTER — Encounter: Payer: Self-pay | Admitting: Family Medicine

## 2020-07-31 VITALS — BP 120/70 | HR 60 | Ht 69.5 in | Wt 238.0 lb

## 2020-07-31 DIAGNOSIS — I10 Essential (primary) hypertension: Secondary | ICD-10-CM

## 2020-07-31 DIAGNOSIS — E782 Mixed hyperlipidemia: Secondary | ICD-10-CM

## 2020-07-31 MED ORDER — ATORVASTATIN CALCIUM 10 MG PO TABS: ORAL_TABLET | ORAL | 1 refills | Status: DC

## 2020-07-31 MED ORDER — HYDROCHLOROTHIAZIDE 12.5 MG PO TABS: 12.5000 mg | ORAL_TABLET | Freq: Every day | ORAL | 1 refills | Status: DC

## 2020-07-31 MED ORDER — QUINAPRIL HCL 20 MG PO TABS: 20.0000 mg | ORAL_TABLET | Freq: Every day | ORAL | 1 refills | Status: DC

## 2020-07-31 NOTE — Progress Notes (Signed)
Date:  07/31/2020   Name:  Ryan Parks   DOB:  01-24-1961   MRN:  [id number]   Chief Complaint: Hyperlipidemia and Hypertension  Hyperlipidemia This is a chronic problem. The current episode started more than 1 year ago. The problem is controlled. Recent lipid tests were reviewed and are normal. He has no history of chronic renal disease, diabetes, hypothyroidism, liver disease, obesity or nephrotic syndrome. Factors aggravating his hyperlipidemia include thiazides. Pertinent negatives include no chest pain, focal sensory loss, focal weakness, leg pain, myalgias or shortness of breath. Current antihyperlipidemic treatment includes statins. The current treatment provides moderate improvement of lipids. There are no compliance problems.  Risk factors for coronary artery disease include dyslipidemia and hypertension.  Hypertension This is a chronic problem. The current episode started more than 1 year ago. The problem has been gradually worsening since onset. The problem is controlled. Pertinent negatives include no anxiety, blurred vision, chest pain, headaches, malaise/fatigue, neck pain, orthopnea, palpitations, peripheral edema, PND, shortness of breath or sweats. There are no associated agents to hypertension. Risk factors for coronary artery disease include dyslipidemia. Past treatments include ACE inhibitors and diuretics. The current treatment provides moderate improvement. There are no compliance problems.  There is no history of angina, kidney disease, CVA, heart failure, left ventricular hypertrophy, PVD or retinopathy. There is no history of chronic renal disease, a hypertension causing med or renovascular disease.    Lab Results  Component Value Date   CREATININE 0.97 01/16/2020   BUN 13 01/16/2020   NA 140 01/16/2020   K 3.9 01/16/2020   CL 99 01/16/2020   CO2 25 01/16/2020   Lab Results  Component Value Date   CHOL 197 01/16/2020   HDL 51 01/16/2020   LDLCALC 125 (H)  01/16/2020   TRIG 115 01/16/2020   CHOLHDL 4.6 11/16/2018   No results found for: TSH No results found for: HGBA1C No results found for: WBC, HGB, HCT, MCV, PLT No results found for: ALT, AST, GGT, ALKPHOS, BILITOT   Review of Systems  Constitutional: Negative for chills, fever and malaise/fatigue.  HENT: Negative for drooling, ear discharge, ear pain and sore throat.   Eyes: Negative for blurred vision.  Respiratory: Negative for cough, shortness of breath and wheezing.   Cardiovascular: Negative for chest pain, palpitations, orthopnea, leg swelling and PND.  Gastrointestinal: Negative for abdominal pain, blood in stool, constipation, diarrhea and nausea.  Endocrine: Negative for polydipsia.  Genitourinary: Negative for dysuria, frequency, hematuria and urgency.  Musculoskeletal: Negative for back pain, myalgias and neck pain.  Skin: Negative for rash.  Allergic/Immunologic: Negative for environmental allergies.  Neurological: Negative for dizziness, focal weakness and headaches.  Hematological: Does not bruise/bleed easily.  Psychiatric/Behavioral: Negative for suicidal ideas. The patient is not nervous/anxious.     Patient Active Problem List   Diagnosis Date Noted  . Polyp of sigmoid colon   . Benign neoplasm of cecum   . Erectile dysfunction due to arterial insufficiency 03/13/2016  . Erectile dysfunction 03/13/2016  . Personal history of colonic polyps   . Benign neoplasm of ascending colon   . Benign neoplasm of descending colon   . Benign neoplasm of sigmoid colon   . Essential hypertension 11/01/2014    No Known Allergies  Past Surgical History:  Procedure Laterality Date  . COLONOSCOPY  2011   [facility]- polyps- repeat in 7  . COLONOSCOPY WITH PROPOFOL N/A 05/31/2015   Procedure: COLONOSCOPY WITH PROPOFOL;  Surgeon: Lucilla Lame, MD;  Location:  [place];  Service: Endoscopy;  Laterality: N/A;  . COLONOSCOPY WITH PROPOFOL N/A 07/01/2018    Procedure: COLONOSCOPY WITH BIOPSY;  Surgeon: Lucilla Lame, MD;  Location: [place];  Service: Endoscopy;  Laterality: N/A;  . POLYPECTOMY  05/31/2015   Procedure: POLYPECTOMY;  Surgeon: Lucilla Lame, MD;  Location: [place];  Service: Endoscopy;;  . POLYPECTOMY N/A 07/01/2018   Procedure: POLYPECTOMY;  Surgeon: Lucilla Lame, MD;  Location: [place];  Service: Endoscopy;  Laterality: N/A;    Social History   Tobacco Use  . Smoking status: Never Smoker  . Smokeless tobacco: Never Used  Vaping Use  . Vaping Use: Never used  Substance Use Topics  . Alcohol use: Yes    Alcohol/week: 0.0 standard drinks    Comment: 1-2 drinks/month  . Drug use: No     Medication list has been reviewed and updated.  Current Meds  Medication Sig  . aspirin 81 MG tablet Take 1 tablet (81 mg total) by mouth daily.  Marland Kitchen atorvastatin (LIPITOR) 10 MG tablet TAKE 1 TABLET DAILY (NEEDS LAB RECHECK)  . hydrochlorothiazide (HYDRODIURIL) 12.5 MG tablet TAKE 1 TABLET DAILY  . loratadine (CLARITIN) 10 MG tablet Take 10 mg by mouth daily.  . quinapril (ACCUPRIL) 20 MG tablet Take 1 tablet (20 mg total) by mouth daily.  . [DISCONTINUED] fluticasone (FLONASE) 50 MCG/ACT nasal spray Place 2 sprays into both nostrils daily.    PHQ 2/9 Scores 07/31/2020 01/16/2020 05/22/2019 11/16/2018  PHQ - 2 Score 0 0 0 0  PHQ- 9 Score 0 0 0 0    GAD 7 : Generalized Anxiety Score 07/31/2020 01/16/2020 05/22/2019  Nervous, Anxious, on Edge 0 0 0  Control/stop worrying 0 0 0  Worry too much - different things 0 0 0  Trouble relaxing 0 0 0  Restless 0 0 0  Easily annoyed or irritable 0 0 0  Afraid - awful might happen 0 0 0  Total GAD 7 Score 0 0 0    BP Readings from Last 3 Encounters:  07/31/20 120/70  01/16/20 120/80  05/22/19 130/80    Physical Exam Vitals and nursing note reviewed.  HENT:     Head: Normocephalic.     Right Ear: Tympanic membrane, ear canal and external ear normal.     Left  Ear: Tympanic membrane, ear canal and external ear normal.     Nose: Nose normal. No congestion or rhinorrhea.     Mouth/Throat:     Mouth: Mucous membranes are moist.  Eyes:     General: No scleral icterus.       Right eye: No discharge.        Left eye: No discharge.     Conjunctiva/sclera: Conjunctivae normal.     Pupils: Pupils are equal, round, and reactive to light.  Neck:     Thyroid: No thyromegaly.     Vascular: No JVD.     Trachea: No tracheal deviation.  Cardiovascular:     Rate and Rhythm: Normal rate and regular rhythm.     Heart sounds: Normal heart sounds. No murmur heard. No friction rub. No gallop.   Pulmonary:     Effort: No respiratory distress.     Breath sounds: Normal breath sounds. No wheezing, rhonchi or rales.  Abdominal:     General: Bowel sounds are normal.     Palpations: Abdomen is soft. There is no mass.     Tenderness: There is no abdominal tenderness. There is  no right CVA tenderness, left CVA tenderness, guarding or rebound.  Musculoskeletal:        General: No tenderness. Normal range of motion.     Cervical back: Normal range of motion and neck supple.  Lymphadenopathy:     Cervical: No cervical adenopathy.  Skin:    General: Skin is warm.     Findings: No rash.  Neurological:     Mental Status: He is alert and oriented to person, place, and time.     Cranial Nerves: No cranial nerve deficit.     Motor: No weakness.     Deep Tendon Reflexes: Reflexes are normal and symmetric.     Wt Readings from Last 3 Encounters:  07/31/20 238 lb (108 kg)  01/16/20 238 lb (108 kg)  05/22/19 238 lb (108 kg)    BP 120/70   Pulse 60   Ht 5' 9.5" (1.765 m)   Wt 238 lb (108 kg)   BMI 34.64 kg/m   Assessment and Plan:  1. Essential hypertension Chronic.  Controlled.  Stable.  Blood pressure today is 120/70.  Continue hydrochlorothiazide 12.5 mg once a day.  We will also continue his Accupril 20 mg once a day.  Will check CMP for electrolytes and  GFR. - hydrochlorothiazide (HYDRODIURIL) 12.5 MG tablet; Take 1 tablet (12.5 mg total) by mouth daily.  Dispense: 90 tablet; Refill: 1 - quinapril (ACCUPRIL) 20 MG tablet; Take 1 tablet (20 mg total) by mouth daily.  Dispense: 90 tablet; Refill: 1 - Comprehensive Metabolic Panel (CMET)  2. Mixed hyperlipidemia Ronalee Belts.  Controlled.  Stable.  Continue atorvastatin 10 mg once a day.  Will check lipid panel for current status of control. - atorvastatin (LIPITOR) 10 MG tablet; TAKE 1 TABLET DAILY  Dispense: 90 tablet; Refill: 1 - Lipid Panel With LDL/HDL Ratio

## 2020-08-01 LAB — COMPREHENSIVE METABOLIC PANEL
ALT: 28 IU/L (ref 0–44)
AST: 22 IU/L (ref 0–40)
Albumin/Globulin Ratio: 2.2 (ref 1.2–2.2)
Albumin: 4.9 g/dL — ABNORMAL HIGH (ref 3.8–4.8)
Alkaline Phosphatase: 43 IU/L — ABNORMAL LOW (ref 44–121)
BUN/Creatinine Ratio: 12 (ref 10–24)
BUN: 13 mg/dL (ref 8–27)
Bilirubin Total: 0.7 mg/dL (ref 0.0–1.2)
CO2: 24 mmol/L (ref 20–29)
Calcium: 9.5 mg/dL (ref 8.6–10.2)
Chloride: 100 mmol/L (ref 96–106)
Creatinine, Ser: 1.05 mg/dL (ref 0.76–1.27)
Globulin, Total: 2.2 g/dL (ref 1.5–4.5)
Glucose: 109 mg/dL — ABNORMAL HIGH (ref 65–99)
Potassium: 4.5 mmol/L (ref 3.5–5.2)
Sodium: 138 mmol/L (ref 134–144)
Total Protein: 7.1 g/dL (ref 6.0–8.5)
eGFR: 81 mL/min/{1.73_m2} (ref >59)

## 2020-08-01 LAB — LIPID PANEL WITH LDL/HDL RATIO
Cholesterol, Total: 202 mg/dL — ABNORMAL HIGH (ref 100–199)
HDL: 53 mg/dL (ref >39)
LDL Chol Calc (NIH): 129 mg/dL — ABNORMAL HIGH (ref 0–99)
LDL/HDL Ratio: 2.4 ratio (ref 0.0–3.6)
Triglycerides: 114 mg/dL (ref 0–149)
VLDL Cholesterol Cal: 20 mg/dL (ref 5–40)

## 2020-09-05 ENCOUNTER — Other Ambulatory Visit: Payer: Self-pay | Admitting: Family Medicine

## 2020-09-05 DIAGNOSIS — I10 Essential (primary) hypertension: Secondary | ICD-10-CM

## 2020-09-25 ENCOUNTER — Other Ambulatory Visit: Payer: Self-pay | Admitting: Family Medicine

## 2020-09-25 DIAGNOSIS — E782 Mixed hyperlipidemia: Secondary | ICD-10-CM

## 2020-12-04 ENCOUNTER — Other Ambulatory Visit: Payer: Self-pay | Admitting: Family Medicine

## 2020-12-04 DIAGNOSIS — I10 Essential (primary) hypertension: Secondary | ICD-10-CM

## 2020-12-04 DIAGNOSIS — E782 Mixed hyperlipidemia: Secondary | ICD-10-CM

## 2020-12-04 NOTE — Telephone Encounter (Signed)
Notes to clinic:  script expired because patient didn't pick them up because he still had medication.  New script need to be sent    Requested Prescriptions  Pending Prescriptions Disp Refills   quinapril (ACCUPRIL) 20 MG tablet 90 tablet 1    Sig: Take 1 tablet (20 mg total) by mouth daily.      Cardiovascular:  ACE Inhibitors Passed - 12/04/2020 12:45 PM      Passed - Cr in normal range and within 180 days    Creatinine, Ser  Date Value Ref Range Status  07/31/2020 1.05 0.76 - 1.27 mg/dL Final          Passed - K in normal range and within 180 days    Potassium  Date Value Ref Range Status  07/31/2020 4.5 3.5 - 5.2 mmol/L Final          Passed - Patient is not pregnant      Passed - Last BP in normal range    BP Readings from Last 1 Encounters:  07/31/20 120/70          Passed - Valid encounter within last 6 months    Recent Outpatient Visits           4 months ago Essential hypertension   [facility] Juline Patch, MD   10 months ago Essential hypertension   [facility] Juline Patch, MD   1 year ago Essential hypertension   [facility] Juline Patch, MD   2 years ago Essential hypertension   [facility] Juline Patch, MD   2 years ago Essential hypertension   [facility] Juline Patch, MD       Future Appointments             In 1 month Juline Patch, MD [facility]               hydrochlorothiazide (HYDRODIURIL) 12.5 MG tablet 90 tablet 1    Sig: TAKE 1 TABLET DAILY (COURTESY REFILL, MUST KEEP UPCOMING APPOINTMENT FOR FURTHER REFILLS)      Cardiovascular: Diuretics - Thiazide Passed - 12/04/2020 12:45 PM      Passed - Ca in normal range and within 360 days    Calcium  Date Value Ref Range Status  07/31/2020 9.5 8.6 - 10.2 mg/dL Final          Passed - Cr in normal range and within 360 days    Creatinine, Ser  Date Value Ref Range Status  07/31/2020 1.05 0.76  - 1.27 mg/dL Final          Passed - K in normal range and within 360 days    Potassium  Date Value Ref Range Status  07/31/2020 4.5 3.5 - 5.2 mmol/L Final          Passed - Na in normal range and within 360 days    Sodium  Date Value Ref Range Status  07/31/2020 138 134 - 144 mmol/L Final          Passed - Last BP in normal range    BP Readings from Last 1 Encounters:  07/31/20 120/70          Passed - Valid encounter within last 6 months    Recent Outpatient Visits           4 months ago Essential hypertension   [facility] Juline Patch, MD  10 months ago Essential hypertension   [facility] Juline Patch, MD   1 year ago Essential hypertension   [facility] Juline Patch, MD   2 years ago Essential hypertension   [facility] Juline Patch, MD   2 years ago Essential hypertension   [place], [place]       Future Appointments             In 1 month Juline Patch, MD [facility]               atorvastatin (LIPITOR) 10 MG tablet 90 tablet 1    Sig: TAKE 1 TABLET DAILY      Cardiovascular:  Antilipid - Statins Failed - 12/04/2020 12:45 PM      Failed - Total Cholesterol in normal range and within 360 days    Cholesterol, Total  Date Value Ref Range Status  07/31/2020 202 (H) 100 - 199 mg/dL Final          Failed - LDL in normal range and within 360 days    LDL Chol Calc (NIH)  Date Value Ref Range Status  07/31/2020 129 (H) 0 - 99 mg/dL Final          Passed - HDL in normal range and within 360 days    HDL  Date Value Ref Range Status  07/31/2020 53 >39 mg/dL Final          Passed - Triglycerides in normal range and within 360 days    Triglycerides  Date Value Ref Range Status  07/31/2020 114 0 - 149 mg/dL Final          Passed - Patient is not pregnant      Passed - Valid encounter within last 12 months    Recent Outpatient Visits            4 months ago Essential hypertension   [place], Deanna C, MD   10 months ago Essential hypertension   [place], Deanna C, MD   1 year ago Essential hypertension   [place], Deanna C, MD   2 years ago Essential hypertension   [place], Deanna C, MD   2 years ago Essential hypertension   [place], Deanna C, MD       Future Appointments             In 1 month Juline Patch, MD [facility]

## 2020-12-04 NOTE — Telephone Encounter (Signed)
Medication Refill - Medication:hydrochlorothiazide (HYDRODIURIL) 12.5 MG and quinapril (ACCUPRIL) 20 MG tablet also cholesterol medicine, patient not sure of name of it  Has the patient contacted their pharmacy? yes (Agent: If no, request that the patient contact the pharmacy for the refill.) (Agent: If yes, when and what did the pharmacy advise?)contact pcp  Preferred Pharmacy (with phone number or street name): [facility] - [address], [place]  Phone:  [phone] Fax:  [phone]  Agent: Please be advised that RX refills may take up to 3 business days. We ask that you follow-up with your pharmacy.

## 2020-12-05 ENCOUNTER — Telehealth: Payer: Self-pay | Admitting: Family Medicine

## 2020-12-05 ENCOUNTER — Other Ambulatory Visit: Payer: Self-pay

## 2020-12-05 DIAGNOSIS — E782 Mixed hyperlipidemia: Secondary | ICD-10-CM

## 2020-12-05 DIAGNOSIS — I10 Essential (primary) hypertension: Secondary | ICD-10-CM

## 2020-12-05 MED ORDER — HYDROCHLOROTHIAZIDE 12.5 MG PO TABS: ORAL_TABLET | ORAL | 0 refills | Status: DC

## 2020-12-05 MED ORDER — QUINAPRIL HCL 20 MG PO TABS: 20.0000 mg | ORAL_TABLET | Freq: Every day | ORAL | 0 refills | Status: DC

## 2020-12-05 MED ORDER — ATORVASTATIN CALCIUM 10 MG PO TABS: ORAL_TABLET | ORAL | 0 refills | Status: DC

## 2020-12-05 NOTE — Telephone Encounter (Signed)
[facility]. [name] called pt to let him know.  KP

## 2020-12-05 NOTE — Telephone Encounter (Signed)
pt would like a callback concerning his [facility]   Pt states when it goes there, he has to pay for it.   Pt would like to ask if you could make an exception and send a 90 day to [facility]? hydrochlorothiazide (HYDRODIURIL) 12.5 MG tablet quinapril (ACCUPRIL) 20 MG tablet atorvastatin (LIPITOR) 10 MG tablet  Pt aware he has appt 9/27.

## 2021-01-21 ENCOUNTER — Ambulatory Visit: Payer: 59 | Admitting: Family Medicine

## 2021-02-20 ENCOUNTER — Ambulatory Visit: Payer: 59 | Admitting: Family Medicine

## 2021-02-20 ENCOUNTER — Encounter: Payer: Self-pay | Admitting: Family Medicine

## 2021-02-20 ENCOUNTER — Other Ambulatory Visit: Payer: Self-pay

## 2021-02-20 VITALS — BP 120/80 | HR 80 | Ht 69.5 in | Wt 231.0 lb

## 2021-02-20 DIAGNOSIS — E782 Mixed hyperlipidemia: Secondary | ICD-10-CM

## 2021-02-20 DIAGNOSIS — Z23 Encounter for immunization: Secondary | ICD-10-CM | POA: Diagnosis not present

## 2021-02-20 DIAGNOSIS — G8929 Other chronic pain: Secondary | ICD-10-CM

## 2021-02-20 DIAGNOSIS — N529 Male erectile dysfunction, unspecified: Secondary | ICD-10-CM

## 2021-02-20 DIAGNOSIS — I10 Essential (primary) hypertension: Secondary | ICD-10-CM | POA: Diagnosis not present

## 2021-02-20 DIAGNOSIS — M25512 Pain in left shoulder: Secondary | ICD-10-CM | POA: Diagnosis not present

## 2021-02-20 MED ORDER — QUINAPRIL HCL 20 MG PO TABS: 20.0000 mg | ORAL_TABLET | Freq: Every day | ORAL | 1 refills | Status: DC

## 2021-02-20 MED ORDER — ATORVASTATIN CALCIUM 10 MG PO TABS: ORAL_TABLET | ORAL | 0 refills | Status: DC

## 2021-02-20 MED ORDER — LORATADINE 10 MG PO TABS: 10.0000 mg | ORAL_TABLET | Freq: Every day | ORAL | 1 refills | Status: DC

## 2021-02-20 MED ORDER — HYDROCHLOROTHIAZIDE 12.5 MG PO TABS: ORAL_TABLET | ORAL | 1 refills | Status: DC

## 2021-02-20 NOTE — Progress Notes (Signed)
Date:  02/20/2021   Name:  Ryan Parks   DOB:  1960-10-16   MRN:  [id number]   Chief Complaint: Allergic Rhinitis , Hypertension, and Hyperlipidemia  Hypertension This is a chronic problem. The current episode started more than 1 year ago. The problem has been gradually improving since onset. The problem is controlled. Pertinent negatives include no anxiety, blurred vision, chest pain, headaches, malaise/fatigue, neck pain, orthopnea, palpitations, peripheral edema, PND, shortness of breath or sweats. There are no associated agents to hypertension. There are no known risk factors for coronary artery disease. Past treatments include ACE inhibitors and diuretics. The current treatment provides moderate improvement. There are no compliance problems.  There is no history of angina, kidney disease, CAD/MI, CVA, heart failure, left ventricular hypertrophy, PVD or retinopathy. There is no history of chronic renal disease, a hypertension causing med or renovascular disease.  Hyperlipidemia This is a chronic problem. The current episode started more than 1 year ago. The problem is controlled. Recent lipid tests were reviewed and are normal. He has no history of chronic renal disease, diabetes, hypothyroidism, liver disease, obesity or nephrotic syndrome. Pertinent negatives include no chest pain or shortness of breath. Current antihyperlipidemic treatment includes statins. The current treatment provides moderate improvement of lipids. There are no compliance problems.    Lab Results  Component Value Date   CREATININE 1.05 07/31/2020   BUN 13 07/31/2020   NA 138 07/31/2020   K 4.5 07/31/2020   CL 100 07/31/2020   CO2 24 07/31/2020   Lab Results  Component Value Date   CHOL 202 (H) 07/31/2020   HDL 53 07/31/2020   LDLCALC 129 (H) 07/31/2020   TRIG 114 07/31/2020   CHOLHDL 4.6 11/16/2018   No results found for: TSH No results found for: HGBA1C No results found for: WBC, HGB, HCT, MCV,  PLT Lab Results  Component Value Date   ALT 28 07/31/2020   AST 22 07/31/2020   ALKPHOS 43 (L) 07/31/2020   BILITOT 0.7 07/31/2020     Review of Systems  Constitutional:  Negative for malaise/fatigue.  Eyes:  Negative for blurred vision.  Respiratory:  Negative for shortness of breath.   Cardiovascular:  Negative for chest pain, palpitations, orthopnea and PND.  Musculoskeletal:  Negative for neck pain.  Neurological:  Negative for headaches.   Patient Active Problem List   Diagnosis Date Noted   Polyp of sigmoid colon    Benign neoplasm of cecum    Erectile dysfunction due to arterial insufficiency 03/13/2016   Erectile dysfunction 03/13/2016   Personal history of colonic polyps    Benign neoplasm of ascending colon    Benign neoplasm of descending colon    Benign neoplasm of sigmoid colon    Essential hypertension 11/01/2014    No Known Allergies  Past Surgical History:  Procedure Laterality Date   COLONOSCOPY  2011   [facility]- polyps- repeat in 7   COLONOSCOPY WITH PROPOFOL N/A 05/31/2015   Procedure: COLONOSCOPY WITH PROPOFOL;  Surgeon: Lucilla Lame, MD;  Location: [place];  Service: Endoscopy;  Laterality: N/A;   COLONOSCOPY WITH PROPOFOL N/A 07/01/2018   Procedure: COLONOSCOPY WITH BIOPSY;  Surgeon: Lucilla Lame, MD;  Location: [place];  Service: Endoscopy;  Laterality: N/A;   POLYPECTOMY  05/31/2015   Procedure: POLYPECTOMY;  Surgeon: Lucilla Lame, MD;  Location: [place];  Service: Endoscopy;;   POLYPECTOMY N/A 07/01/2018   Procedure: POLYPECTOMY;  Surgeon: Lucilla Lame, MD;  Location: [facility];  Service: Endoscopy;  Laterality: N/A;    Social History   Tobacco Use   Smoking status: Never   Smokeless tobacco: Never  Vaping Use   Vaping Use: Never used  Substance Use Topics   Alcohol use: Yes    Alcohol/week: 0.0 standard drinks    Comment: 1-2 drinks/month   Drug use: No     Medication list has been  reviewed and updated.  Current Meds  Medication Sig   aspirin 81 MG tablet Take 1 tablet (81 mg total) by mouth daily.   atorvastatin (LIPITOR) 10 MG tablet TAKE 1 TABLET DAILY   hydrochlorothiazide (HYDRODIURIL) 12.5 MG tablet TAKE 1 TABLET DAILY (COURTESY REFILL, MUST KEEP UPCOMING APPOINTMENT FOR FURTHER REFILLS)   ibuprofen (ADVIL) 800 MG tablet TAKE 1 TABLET (800 MG TOTAL) BY MOUTH EVERY 8 (EIGHT) HOURS AS NEEDED FOR MODERATE PAIN.   loratadine (CLARITIN) 10 MG tablet Take 10 mg by mouth daily.   quinapril (ACCUPRIL) 20 MG tablet Take 1 tablet (20 mg total) by mouth daily.    PHQ 2/9 Scores 07/31/2020 01/16/2020 05/22/2019 11/16/2018  PHQ - 2 Score 0 0 0 0  PHQ- 9 Score 0 0 0 0    GAD 7 : Generalized Anxiety Score 07/31/2020 01/16/2020 05/22/2019  Nervous, Anxious, on Edge 0 0 0  Control/stop worrying 0 0 0  Worry too much - different things 0 0 0  Trouble relaxing 0 0 0  Restless 0 0 0  Easily annoyed or irritable 0 0 0  Afraid - awful might happen 0 0 0  Total GAD 7 Score 0 0 0    BP Readings from Last 3 Encounters:  02/20/21 120/80  07/31/20 120/70  01/16/20 120/80    Physical Exam  Wt Readings from Last 3 Encounters:  02/20/21 231 lb (104.8 kg)  07/31/20 238 lb (108 kg)  01/16/20 238 lb (108 kg)    BP 120/80   Pulse 80   Ht 5' 9.5" (1.765 m)   Wt 231 lb (104.8 kg)   BMI 33.62 kg/m   Assessment and Plan:  1. Essential hypertension Chronic.  Controlled.  Stable.  Blood pressure today is 120/80 continue hydrochlorothiazide 12.5 mg once a day and quinapril 20 mg once a day.  Review of previous CMP is acceptable for electrolytes and GFR.  We will recheck patient in 6 months - hydrochlorothiazide (HYDRODIURIL) 12.5 MG tablet; TAKE 1 TABLET DAILY  Dispense: 90 tablet; Refill: 1 - quinapril (ACCUPRIL) 20 MG tablet; Take 1 tablet (20 mg total) by mouth daily.  Dispense: 90 tablet; Refill: 1  2. Mixed hyperlipidemia Chronic.  Controlled.  Stable.  Continue atorvastatin  10 mg once a day. - atorvastatin (LIPITOR) 10 MG tablet; TAKE 1 TABLET DAILY  Dispense: 90 tablet; Refill: 0  3. Erectile dysfunction, unspecified erectile as noted dysfunction type Not taking medication we will patient will call if needs refills  4. Chronic left shoulder pain History of chronic shoulder pain with a history of the other shoulder having rotator cuff injury.  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  5. Need for immunization against influenza Discussed and administered - Flu Vaccine QUAD 66mo+IM (Fluarix, Fluzone & Alfiuria Quad PF)

## 2021-02-20 NOTE — Patient Instructions (Signed)
Prediabetes Eating Plan ?Prediabetes is a condition that causes blood sugar (glucose) levels to be higher than normal. This increases the risk for developing type 2 diabetes (type 2 diabetes mellitus). Working with a health care provider or nutrition specialist (dietitian) to make diet and lifestyle changes can help prevent the onset of diabetes. These changes may help you: ?Control your blood glucose levels. ?Improve your cholesterol levels. ?Manage your blood pressure. ?What are tips for following this plan? ?Reading food labels ?Read food labels to check the amount of fat, salt (sodium), and sugar in prepackaged foods. Avoid foods that have: ?Saturated fats. ?Trans fats. ?Added sugars. ?Avoid foods that have more than 300 milligrams (mg) of sodium per serving. Limit your sodium intake to less than 2,300 mg each day. ?Shopping ?Avoid buying pre-made and processed foods. ?Avoid buying drinks with added sugar. ?Cooking ?Cook with olive oil. Do not use butter, lard, or ghee. ?Bake, broil, grill, steam, or boil foods. Avoid frying. ?Meal planning ? ?Work with your dietitian to create an eating plan that is right for you. This may include tracking how many calories you take in each day. Use a food diary, notebook, or mobile application to track what you eat at each meal. ?Consider following a Mediterranean diet. This includes: ?Eating several servings of fresh fruits and vegetables each day. ?Eating fish at least twice a week. ?Eating one serving each day of whole grains, beans, nuts, and seeds. ?Using olive oil instead of other fats. ?Limiting alcohol. ?Limiting red meat. ?Using nonfat or low-fat dairy products. ?Consider following a plant-based diet. This includes dietary choices that focus on eating mostly vegetables and fruit, grains, beans, nuts, and seeds. ?If you have high blood pressure, you may need to limit your sodium intake or follow a diet such as the DASH (Dietary Approaches to Stop Hypertension) eating  plan. The DASH diet aims to lower high blood pressure. ?Lifestyle ?Set weight loss goals with help from your health care team. It is recommended that most people with prediabetes lose 7% of their body weight. ?Exercise for at least 30 minutes 5 or more days a week. ?Attend a support group or seek support from a mental health counselor. ?Take over-the-counter and prescription medicines only as told by your health care provider. ?What foods are recommended? ?Fruits ?Berries. Bananas. Apples. Oranges. Grapes. Papaya. Mango. Pomegranate. Kiwi. Grapefruit. Cherries. ?Vegetables ?Lettuce. Spinach. Peas. Beets. Cauliflower. Cabbage. Broccoli. Carrots. Tomatoes. Squash. Eggplant. Herbs. Peppers. Onions. Cucumbers. Brussels sprouts. ?Grains ?Whole grains, such as whole-wheat or whole-grain breads, crackers, cereals, and pasta. Unsweetened oatmeal. Bulgur. Barley. Quinoa. Brown rice. Corn or whole-wheat flour tortillas or taco shells. ?Meats and other proteins ?Seafood. Poultry without skin. Lean cuts of pork and beef. Tofu. Eggs. Nuts. Beans. ?Dairy ?Low-fat or fat-free dairy products, such as yogurt, cottage cheese, and cheese. ?Beverages ?Water. Tea. Coffee. Sugar-free or diet soda. Seltzer water. Low-fat or nonfat milk. Milk alternatives, such as soy or almond milk. ?Fats and oils ?Olive oil. Canola oil. Sunflower oil. Grapeseed oil. Avocado. Walnuts. ?Sweets and desserts ?Sugar-free or low-fat pudding. Sugar-free or low-fat ice cream and other frozen treats. ?Seasonings and condiments ?Herbs. Sodium-free spices. Mustard. Relish. Low-salt, low-sugar ketchup. Low-salt, low-sugar barbecue sauce. Low-fat or fat-free mayonnaise. ?The items listed above may not be a complete list of recommended foods and beverages. Contact a dietitian for more information. ?What foods are not recommended? ?Fruits ?Fruits canned with syrup. ?Vegetables ?Canned vegetables. Frozen vegetables with butter or cream sauce. ?Grains ?Refined white  flour and flour   products, such as bread, pasta, snack foods, and cereals. Meats and other proteins Fatty cuts of meat. Poultry with skin. Breaded or fried meat. Processed meats. Dairy Full-fat yogurt, cheese, or milk. Beverages Sweetened drinks, such as iced tea and soda. Fats and oils Butter. Lard. Ghee. Sweets and desserts Baked goods, such as cake, cupcakes, pastries, cookies, and cheesecake. Seasonings and condiments Spice mixes with added salt. Ketchup. Barbecue sauce. Mayonnaise. The items listed above may not be a complete list of foods and beverages that are not recommended. Contact a dietitian for more information. Where to find more information American Diabetes Association: www.diabetes.org Summary You may need to make diet and lifestyle changes to help prevent the onset of diabetes. These changes can help you control blood sugar, improve cholesterol levels, and manage blood pressure. Set weight loss goals with help from your health care team. It is recommended that most people with prediabetes lose 7% of their body weight. Consider following a Mediterranean diet. This includes eating plenty of fresh fruits and vegetables, whole grains, beans, nuts, seeds, fish, and low-fat dairy, and using olive oil instead of other fats. This information is not intended to replace advice given to you by your health care provider. Make sure you discuss any questions you have with your health care provider. Document Revised: 07/13/2019 Document Reviewed: 07/13/2019 Elsevier Patient Education  [place]  LOW-CHOLESTEROL, LOW-TRIGLYCERIDE DIETS    FOODS TO USE   MEATS, FISH Choose lean meats (chicken, [place], veal, and non-fatty cuts of beef with excess fat trimmed; one serving = 3 oz of cooked meat). Also, fresh or frozen fish, canned fish packed in water, and shellfish (lobster, crabs, shrimp, and oysters). Limit use to no more than one serving of one of these per week.  Shellfish are high in cholesterol but low in saturated fat and should be used sparingly. Meats and fish should be broiled (pan or oven) or baked on a rack.  EGGS Egg substitutes and egg whites (use freely). Egg yolks (limit two per week).  FRUITS Eat three servings of fresh fruit per day (1 serving =  cup). Be sure to have at least one citrus fruit daily. Frozen and canned fruit with no sugar or syrup added may be used.  VEGETABLES Most vegetables are not limited (see next page). One dark-green (string beans, escarole) or one deep yellow (squash) vegetable is recommended daily. Cauliflower, broccoli, and celery, as well as potato skins, are recommended for their fiber content. (Fiber is associated with cholesterol reduction) It is preferable to steam vegetables, but they may be boiled, strained, or braised with polyunsaturated vegetable oil (see below).  BEANS Dried peas or beans (1 serving =  cup) may be used as a bread substitute.  NUTS Almonds, walnuts, and peanuts may be used sparingly  (1 serving = 1 Tablespoonful). Use pumpkin, sesame, or sunflower seeds.  BREADS, GRAINS One roll or one slice of whole grain or enriched bread may be used, or three soda crackers or four pieces of melba toast as a substitute. Spaghetti, rice or noodles ( cup) or  large ear of corn may be used as a bread substitute. In preparing these foods do not use butter or shortening, use soft margarine. Also use egg and sugar substitutes.  Choose high fiber grains, such as oats and whole wheat.  CEREALS Use  cup of hot cereal or  cup of cold cereal per day. Add a sugar substitute if desired, with 99% fat free or skim  milk.  MILK PRODUCTS Always use 99% fat free or skim milk, dairy products such as low fat cheeses (farmer's uncreamed diet cottage), low-fat yogurt, and powdered skim milk.  FATS, OILS Use soft (not stick) margarine; vegetable oils that are high in polyunsaturated fats (such as safflower, sunflower, soybean,  corn, and cottonseed). Always refrigerate meat drippings to harden the fat and remove it before preparing gravies  DESSERTS, SNACKS Limit to two servings per day; substitute each serving for a bread/cereal serving: ice milk, water sherbet (1/4 cup); unflavored gelatin or gelatin flavored with sugar substitute (1/3 cup); pudding prepared with skim milk (1/2 cup); egg white souffls; unbuttered popcorn (1  cups). Substitute carob for chocolate.  BEVERAGES Fresh fruit juices (limit 4 oz per day); black coffee, plain or herbal teas; soft drinks with sugar substitutes; club soda, preferably salt-free; cocoa made with skim milk or nonfat dried milk and water (sugar substitute added if desired); clear broth. Alcohol: limit two servings per day (see second page).  MISCELLANEOUS  You may use the following freely: vinegar, spices, herbs, nonfat bouillon, mustard, Worcestershire sauce, soy sauce, flavoring essence.                  GUIDELINES FOR  LOW-CHOLESTEROL, LOW TRIGLYCERIDE DIETS    FOODS TO AVOID   MEATS, FISH Marbled beef, pork, bacon, sausage, and other pork products; fatty fowl (duck, goose); skin and fat of [place] and chicken; processed meats; luncheon meats (salami, bologna); frankfurters and fast-food hamburgers (theyre loaded with fat); organ meats (kidneys, liver); canned fish packed in oil.  EGGS Limit egg yolks to two per week.   FRUITS Coconuts (rich in saturated fats).  VEGETABLES Avoid avocados. Starchy vegetables (potatoes, corn, lima beans, dried peas, beans) may be used only if substitutes for a serving of bread or cereal. (Baked potato skin, however, is desirable for its fiber content.  BEANS Commercial baked beans with sugar and/or pork added.  NUTS Avoid nuts.  Limit peanuts and walnuts to one tablespoonful per day.  BREADS, GRAINS Any baked goods with shortening and/or sugar. Commercial mixes with dried eggs and whole milk. Avoid sweet rolls, doughnuts, breakfast  pastries (Danish), and sweetened packaged cereals (the added sugar converts readily to triglycerides).  MILK PRODUCTS Whole milk and whole-milk packaged goods; cream; ice cream; whole-milk puddings, yogurt, or cheeses; nondairy cream substitutes.  FATS, OILS Butter, lard, animal fats, bacon drippings, gravies, cream sauces as well as palm and coconut oils. All these are high in saturated fats. Examine labels on cholesterol free products for hydrogenated fats. (These are oils that have been hardened into solids and in the process have become saturated.)  DESSERTS, SNACKS Fried snack foods like potato chips; chocolate; candies in general; jams, jellies, syrups; whole- milk puddings; ice cream and milk sherbets; hydrogenated peanut butter.  BEVERAGES Sugared fruit juices and soft drinks; cocoa made with whole milk and/or sugar. When using alcohol (1 oz liquor, 5 oz beer, or 2  oz dry table wine per serving), one serving must be substituted for one bread or cereal serving (limit, two servings of alcohol per day).   SPECIAL NOTES    Remember that even non-limited foods should be used in moderation. While on a cholesterol-lowering diet, be sure to avoid animal fats and marbled meats. 3. While on a triglyceride-lowering diet, be sure to avoid sweets and to control the amount of carbohydrates you eat (starchy foods such as flour, bread, potatoes).While on a tri-glyceride-lowering diet, be sure to avoid sweets Buy  a good low-fat cookbook, such as the one published by the American Heart Association. Consult your physician if you have any questions.               [facility] Low Glycemic Diet Plan   Low Glycemic Foods (20-49) Moderate Glycemic Foods (50-69) High Glycemic Foods (70-100)      Breakfast Creals Breakfast Cereals Breakfast Cereals  All Bran All-Bran Fruit'n Oats   Bran Buds Bran Chex   Cheerios Corn chex    Fiber One Oatmeal (not instant)   Just Right Mini-Wheats   Corn  Flakes Cream of Wheat    Oat Bran Special K Swiss Muesli   Grape Nuts Grape Nut Flakes      Grits Nutri-Grain    Fruits and fruit juice: Fruits Puffed Rice Puffed Wheat    (Limit to 1-2 Servings per day) Banana (under-ride) Dates   Rice Chex Rice Krispies    Apples Apricots (fresh/dried)   Figs Grapes   Shredded Wheat Team    Blackberries Blueberries   Kiwi Mango   Total     Cherries Cranberries   Oranges Raisins     Peaches Pears    Fruits  Plums Prunes   Fruit Juices Pineapple Watermelon    Grapefruit Raspberries   Cranberry Juice Orange Juice   Banana (over-ripe)     Strawberries Tangerines      Apple Juice Grapefruit Juice   Beans and Legumes Beverages  Tomato Juice    [place]-type baked beans Sodas, sweet tea, pineapple juice   Canned pinto, kidney, or navy beans   Beans and Legumes (fresh-cooked) Green peas Vegetables  Black-eyed peas Butter Beans    Potato, baked, boiled, fried, mashed  Chick peas Lentils   Vegetables French fries  Green beans Lima beans   Beets Carrots   Canned or frozen corn  Kidney beans Navy beans   Sweet potato Yam   Parsnips  Pinto beans Snow peas   Corn on the cob Winter squash      Non-starchy vegetables Grains Breads  Asparagus, avocado, broccoli, cabbage Cornmeal Rice, brown   Most breads (white and whole grain)  cauliflower, celery, cucumber, greens Rice, white Couscous   Bagels Bread sticks    lettuce, mushrooms, peppers, tomatoes  Bread stuffing Kaiser roll    okra, onions, spinach, summer squash Pasta Dinner rolls   [employer], cheese     Grains Ravioli, meat filled Spaghetti, white   Grains  Barley Bulgur    Rice, instant Tapioca, with milk    Rye Wild rice   Nuts    Cashews Macadamia   Candy and most cookies  Nuts and oils    Almonds, peanuts, sunflower seeds Snacks Snacks  hazelnuts, pecans, walnuts Chocolate Ice cream, lowfat   Donuts Corn chips    Oils that are liquid at room temperature Muffin  Popcorn   Jelly beans Pretzels      Pastries  Dairy, fish, meat, soy, and eggs    Milk, skim Lowfat cheese    Restaurant and ethnic foods  Yogurt, lowfat, fruit sugar sweetened  Most [place] food (sugar in stir fry    or wok sauce)  Lean red meat Fish    Teriyaki-style meats and vegetables  Skinless chicken and [place], shellfish        Egg whites (up to 3 daily), Soy Products    Egg yolks (up to 7 or _____ per week)

## 2021-05-21 ENCOUNTER — Other Ambulatory Visit: Payer: Self-pay | Admitting: Family Medicine

## 2021-05-21 DIAGNOSIS — E782 Mixed hyperlipidemia: Secondary | ICD-10-CM

## 2021-05-21 NOTE — Telephone Encounter (Signed)
Requested Prescriptions  Pending Prescriptions Disp Refills   atorvastatin (LIPITOR) 10 MG tablet [Pharmacy Med Name: ATORVASTATIN TABS 10MG ] 90 tablet 0    Sig: TAKE 1 TABLET DAILY     Cardiovascular:  Antilipid - Statins Failed - 05/21/2021 12:54 AM      Failed - Total Cholesterol in normal range and within 360 days    Cholesterol, Total  Date Value Ref Range Status  07/31/2020 202 (H) 100 - 199 mg/dL Final         Failed - LDL in normal range and within 360 days    LDL Chol Calc (NIH)  Date Value Ref Range Status  07/31/2020 129 (H) 0 - 99 mg/dL Final         Passed - HDL in normal range and within 360 days    HDL  Date Value Ref Range Status  07/31/2020 53 >39 mg/dL Final         Passed - Triglycerides in normal range and within 360 days    Triglycerides  Date Value Ref Range Status  07/31/2020 114 0 - 149 mg/dL Final         Passed - Patient is not pregnant      Passed - Valid encounter within last 12 months    Recent Outpatient Visits          3 months ago Essential hypertension   Hoyt, Wilkes, MD   9 months ago Essential hypertension   Dinwiddie, Deanna C, MD   1 year ago Essential hypertension   McConnells, Deanna C, MD   2 years ago Essential hypertension   Washburn, Deanna C, MD   2 years ago Essential hypertension   [place]omar, Deanna C, MD      Future Appointments            In 3 months Juline Patch, MD [facility], [facility]

## 2021-07-11 ENCOUNTER — Other Ambulatory Visit: Payer: Self-pay | Admitting: Family Medicine

## 2021-07-11 DIAGNOSIS — I10 Essential (primary) hypertension: Secondary | ICD-10-CM

## 2021-07-11 MED ORDER — QUINAPRIL HCL 20 MG PO TABS: 20.0000 mg | ORAL_TABLET | Freq: Every day | ORAL | 0 refills | Status: DC

## 2021-07-11 NOTE — Telephone Encounter (Signed)
Medication Refill - Medication: quinapril (ACCUPRIL) 20 MG tablet ? ?Has the patient contacted their pharmacy? Yes.   ?[facility] and to get at his local pharmacy ? ?Preferred Pharmacy (with phone number or street name): [id number] [place] [place]? ?Has the patient been seen for an appointment in the last year OR does the patient have an upcoming appointment? Yes.   ? ?Agent: Please be advised that RX refills may take up to 3 business days. We ask that you follow-up with your pharmacy. ? ? ?

## 2021-07-11 NOTE — Telephone Encounter (Signed)
Requested medication (s) are due for refill today: see note ? ?Requested medication (s) are on the active medication list: yes ? ?Last refill:  07/11/21 #60 0 refill ? ?Future visit scheduled: yes 1 month ? ?Notes to clinic:  Pharmacy comment: Alternative Requested:DRUG ON BACK ORDER ? ? ?  ?Requested Prescriptions  ?Pending Prescriptions Disp Refills  ? quinapril (ACCUPRIL) 20 MG tablet [Pharmacy Med Name: QUINAPRIL 20 MG TABLET] 60 tablet 0  ?  Sig: TAKE 1 TABLET BY MOUTH EVERY DAY  ?  ? Cardiovascular: ACE Inhibitors 2 Failed - 07/11/2021  1:08 PM  ?  ?  Failed - K in normal range and within 180 days  ?  Potassium  ?Date Value Ref Range Status  ?07/31/2020 4.5 3.5 - 5.2 mmol/L Final  ?  ?  ?  ?  Failed - Cr in normal range and within 180 days  ?  Creatinine, Ser  ?Date Value Ref Range Status  ?07/31/2020 1.05 0.76 - 1.27 mg/dL Final  ?  ?  ?  ?  Failed - eGFR is 30 or above and within 180 days  ?  GFR calc Af Amer  ?Date Value Ref Range Status  ?01/16/2020 98 >59 mL/min/1.73 Final  ?  Comment:  ?  **[facility] currently reports eGFR in compliance with the current** ?  recommendations of the [employer]. [facility] will ?  update reporting as new guidelines are published from the NKF-ASN ?  Task force. ?  ? ?GFR calc non Af Amer  ?Date Value Ref Range Status  ?01/16/2020 84 >59 mL/min/1.73 Final  ? ?eGFR  ?Date Value Ref Range Status  ?07/31/2020 81 >59 mL/min/1.73 Final  ?  ?  ?  ?  Passed - Patient is not pregnant  ?  ?  Passed - Last BP in normal range  ?  BP Readings from Last 1 Encounters:  ?02/20/21 120/80  ?  ?  ?  ?  Passed - Valid encounter within last 6 months  ?  Recent Outpatient Visits   ? ?      ? 4 months ago Essential hypertension  ? [facility] Juline Patch, MD  ? 11 months ago Essential hypertension  ? [facility] Juline Patch, MD  ? 1 year ago Essential hypertension  ? [facility] Juline Patch, MD  ? 2 years ago Essential hypertension  ? [facility] Juline Patch, MD  ? 2 years ago Essential hypertension  ? [facility] Juline Patch, MD  ? ?  ?  ?Future Appointments   ? ?        ? In 1 month Juline Patch, MD [facility], [place]  ? ?  ? ?  ?  ?  ? ?

## 2021-07-11 NOTE — Telephone Encounter (Signed)
Requested medication (s) are due for refill today: Yes ? ?Requested medication (s) are on the active medication list: Yes ? ?Last refill:  02/20/21 ? ?Future visit scheduled: Yes ? ?Notes to clinic:  Protocol indicates pt. Needs lab work. ? ? ? ?Requested Prescriptions  ?Pending Prescriptions Disp Refills  ? quinapril (ACCUPRIL) 20 MG tablet 90 tablet 1  ?  Sig: Take 1 tablet (20 mg total) by mouth daily.  ?  ? Cardiovascular: ACE Inhibitors 2 Failed - 07/11/2021  9:56 AM  ?  ?  Failed - K in normal range and within 180 days  ?  Potassium  ?Date Value Ref Range Status  ?07/31/2020 4.5 3.5 - 5.2 mmol/L Final  ?  ?  ?  ?  Failed - Cr in normal range and within 180 days  ?  Creatinine, Ser  ?Date Value Ref Range Status  ?07/31/2020 1.05 0.76 - 1.27 mg/dL Final  ?  ?  ?  ?  Failed - eGFR is 30 or above and within 180 days  ?  GFR calc Af Amer  ?Date Value Ref Range Status  ?01/16/2020 98 >59 mL/min/1.73 Final  ?  Comment:  ?  **[facility] currently reports eGFR in compliance with the current** ?  recommendations of the [employer]. [facility] will ?  update reporting as new guidelines are published from the NKF-ASN ?  Task force. ?  ? ?GFR calc non Af Amer  ?Date Value Ref Range Status  ?01/16/2020 84 >59 mL/min/1.73 Final  ? ?eGFR  ?Date Value Ref Range Status  ?07/31/2020 81 >59 mL/min/1.73 Final  ?  ?  ?  ?  Passed - Patient is not pregnant  ?  ?  Passed - Last BP in normal range  ?  BP Readings from Last 1 Encounters:  ?02/20/21 120/80  ?  ?  ?  ?  Passed - Valid encounter within last 6 months  ?  Recent Outpatient Visits   ? ?      ? 4 months ago Essential hypertension  ? [facility] Juline Patch, MD  ? 11 months ago Essential hypertension  ? [facility] Juline Patch, MD  ? 1 year ago Essential hypertension  ? [facility] Juline Patch, MD  ? 2 years ago Essential hypertension  ? [facility] Juline Patch, MD  ? 2 years ago Essential hypertension  ? [facility] Juline Patch, MD  ? ?  ?  ?Future Appointments   ? ?        ? In 1 month Juline Patch, MD [facility], [place]  ? ?  ? ?  ?  ?  ? ?

## 2021-07-16 ENCOUNTER — Telehealth: Payer: Self-pay | Admitting: Family Medicine

## 2021-07-16 ENCOUNTER — Telehealth: Payer: Self-pay

## 2021-07-16 ENCOUNTER — Other Ambulatory Visit: Payer: Self-pay

## 2021-07-16 DIAGNOSIS — I10 Essential (primary) hypertension: Secondary | ICD-10-CM

## 2021-07-16 MED ORDER — VALSARTAN 40 MG PO TABS
40.0000 mg | ORAL_TABLET | Freq: Every day | ORAL | 1 refills | Status: DC
Start: 2021-07-16 — End: 2021-08-21

## 2021-07-16 NOTE — Telephone Encounter (Signed)
Called pt- changed quinapril to valsartan and recheck in 1 month- sent in med to [facility] ?

## 2021-07-16 NOTE — Telephone Encounter (Signed)
Copied from [place] [id number]. Topic: General - Other ?>> Jul 16, 2021  7:34 AM Leward Quan A wrote: ?Reason for CRM: Patient called in to inform Dr Ronnald Ramp that per pharmacy the quinapril (ACCUPRIL) 20 MG tablet will no longer be made so he need an Rx called in for a new medication and to also inform her that he only have 3 pills left so please send new Rx ASAP any questions please call patient. ?[id number] [address] [place]:  [phone] ?Fax:  [phone] ?  ? ?

## 2021-08-13 ENCOUNTER — Other Ambulatory Visit: Payer: Self-pay | Admitting: Family Medicine

## 2021-08-13 DIAGNOSIS — I10 Essential (primary) hypertension: Secondary | ICD-10-CM

## 2021-08-13 NOTE — Telephone Encounter (Signed)
Pt has refill available. Request sent via Interface. ?Pt has appt 08/21/21 ?Requested Prescriptions  ?Pending Prescriptions Disp Refills  ?? valsartan (DIOVAN) 40 MG tablet [Pharmacy Med Name: VALSARTAN 40 MG TABLET] 30 tablet 1  ?  Sig: TAKE 1 TABLET BY MOUTH EVERY DAY  ?  ? Cardiovascular:  Angiotensin Receptor Blockers Failed - 08/13/2021  3:03 PM  ?  ?  Failed - Cr in normal range and within 180 days  ?  Creatinine, Ser  ?Date Value Ref Range Status  ?07/31/2020 1.05 0.76 - 1.27 mg/dL Final  ?   ?  ?  Failed - K in normal range and within 180 days  ?  Potassium  ?Date Value Ref Range Status  ?07/31/2020 4.5 3.5 - 5.2 mmol/L Final  ?   ?  ?  Passed - Patient is not pregnant  ?  ?  Passed - Last BP in normal range  ?  BP Readings from Last 1 Encounters:  ?02/20/21 120/80  ?   ?  ?  Passed - Valid encounter within last 6 months  ?  Recent Outpatient Visits   ?      ? 5 months ago Essential hypertension  ? [facility] Juline Patch, MD  ? 1 year ago Essential hypertension  ? [facility] Juline Patch, MD  ? 1 year ago Essential hypertension  ? [facility] Juline Patch, MD  ? 2 years ago Essential hypertension  ? [facility] Juline Patch, MD  ? 2 years ago Essential hypertension  ? [facility] Juline Patch, MD  ?  ?  ?Future Appointments   ?        ? In 1 week Juline Patch, MD [facility], [place]  ?  ? ?  ?  ?  ? ? ?

## 2021-08-19 ENCOUNTER — Other Ambulatory Visit: Payer: Self-pay | Admitting: Family Medicine

## 2021-08-19 DIAGNOSIS — E782 Mixed hyperlipidemia: Secondary | ICD-10-CM

## 2021-08-21 ENCOUNTER — Encounter: Payer: Self-pay | Admitting: Family Medicine

## 2021-08-21 ENCOUNTER — Ambulatory Visit: Payer: 59 | Admitting: Family Medicine

## 2021-08-21 ENCOUNTER — Telehealth: Payer: Self-pay

## 2021-08-21 VITALS — BP 120/80 | HR 80 | Ht 69.5 in | Wt 233.0 lb

## 2021-08-21 DIAGNOSIS — E782 Mixed hyperlipidemia: Secondary | ICD-10-CM

## 2021-08-21 DIAGNOSIS — I10 Essential (primary) hypertension: Secondary | ICD-10-CM

## 2021-08-21 DIAGNOSIS — M19031 Primary osteoarthritis, right wrist: Secondary | ICD-10-CM | POA: Diagnosis not present

## 2021-08-21 DIAGNOSIS — Z1211 Encounter for screening for malignant neoplasm of colon: Secondary | ICD-10-CM

## 2021-08-21 DIAGNOSIS — R739 Hyperglycemia, unspecified: Secondary | ICD-10-CM | POA: Diagnosis not present

## 2021-08-21 MED ORDER — VALSARTAN 40 MG PO TABS: 40.0000 mg | ORAL_TABLET | Freq: Every day | ORAL | 1 refills | Status: DC

## 2021-08-21 MED ORDER — IBUPROFEN 800 MG PO TABS: 800.0000 mg | ORAL_TABLET | Freq: Three times a day (TID) | ORAL | 5 refills | Status: DC | PRN

## 2021-08-21 MED ORDER — LORATADINE 10 MG PO TABS: 10.0000 mg | ORAL_TABLET | Freq: Every day | ORAL | 1 refills | Status: DC

## 2021-08-21 MED ORDER — HYDROCHLOROTHIAZIDE 12.5 MG PO TABS: ORAL_TABLET | ORAL | 1 refills | Status: DC

## 2021-08-21 MED ORDER — ATORVASTATIN CALCIUM 10 MG PO TABS: 10.0000 mg | ORAL_TABLET | Freq: Every day | ORAL | 1 refills | Status: DC

## 2021-08-21 NOTE — Progress Notes (Signed)
? ? ?Date:  08/21/2021  ? ?Name:  Ryan Parks   DOB:  Jun 08, 1960   MRN:  [id number] ? ? ?Chief Complaint: Allergic Rhinitis , Hypertension, and Hyperlipidemia ? ?Hypertension ?This is a chronic problem. The current episode started more than 1 year ago. The problem has been gradually improving since onset. The problem is controlled. Pertinent negatives include no anxiety, blurred vision, chest pain, headaches, malaise/fatigue, neck pain, orthopnea, palpitations, peripheral edema, PND, shortness of breath or sweats. There are no associated agents to hypertension. There are no known risk factors for coronary artery disease. Past treatments include angiotensin blockers and diuretics. The current treatment provides moderate improvement. There are no compliance problems.  There is no history of angina, kidney disease, CAD/MI, CVA, heart failure, left ventricular hypertrophy, PVD or retinopathy. There is no history of chronic renal disease, a hypertension causing med or renovascular disease.  ?Hyperlipidemia ?This is a chronic problem. The current episode started more than 1 year ago. The problem is controlled. Recent lipid tests were reviewed and are normal. He has no history of chronic renal disease, diabetes, hypothyroidism, liver disease, obesity or nephrotic syndrome. Factors aggravating his hyperlipidemia include thiazides. Pertinent negatives include no chest pain, myalgias or shortness of breath. Current antihyperlipidemic treatment includes statins. The current treatment provides moderate improvement of lipids.  ? ?Lab Results  ?Component Value Date  ? NA 138 07/31/2020  ? K 4.5 07/31/2020  ? CO2 24 07/31/2020  ? GLUCOSE 109 (H) 07/31/2020  ? BUN 13 07/31/2020  ? CREATININE 1.05 07/31/2020  ? CALCIUM 9.5 07/31/2020  ? EGFR 81 07/31/2020  ? GFRNONAA 84 01/16/2020  ? ?Lab Results  ?Component Value Date  ? CHOL 202 (H) 07/31/2020  ? HDL 53 07/31/2020  ? LDLCALC 129 (H) 07/31/2020  ? TRIG 114 07/31/2020  ? CHOLHDL  4.6 11/16/2018  ? ?No results found for: TSH ?No results found for: HGBA1C ?No results found for: WBC, HGB, HCT, MCV, PLT ?Lab Results  ?Component Value Date  ? ALT 28 07/31/2020  ? AST 22 07/31/2020  ? ALKPHOS 43 (L) 07/31/2020  ? BILITOT 0.7 07/31/2020  ? ?No results found for: 25OHVITD2, [place], VD25OH  ? ?Review of Systems  ?Constitutional:  Negative for chills, fever and malaise/fatigue.  ?HENT:  Negative for drooling, ear discharge, ear pain and sore throat.   ?Eyes:  Negative for blurred vision.  ?Respiratory:  Negative for cough, shortness of breath and wheezing.   ?Cardiovascular:  Negative for chest pain, palpitations, orthopnea, leg swelling and PND.  ?Gastrointestinal:  Negative for abdominal pain, blood in stool, constipation, diarrhea and nausea.  ?Endocrine: Negative for polydipsia.  ?Genitourinary:  Negative for dysuria, frequency, hematuria and urgency.  ?Musculoskeletal:  Negative for back pain, myalgias and neck pain.  ?Skin:  Negative for rash.  ?Allergic/Immunologic: Negative for environmental allergies.  ?Neurological:  Negative for dizziness and headaches.  ?Hematological:  Does not bruise/bleed easily.  ?Psychiatric/Behavioral:  Negative for suicidal ideas. The patient is not nervous/anxious.   ? ?Patient Active Problem List  ? Diagnosis Date Noted  ? Polyp of sigmoid colon   ? Benign neoplasm of cecum   ? Erectile dysfunction due to arterial insufficiency 03/13/2016  ? Erectile dysfunction 03/13/2016  ? Personal history of colonic polyps   ? Benign neoplasm of ascending colon   ? Benign neoplasm of descending colon   ? Benign neoplasm of sigmoid colon   ? Essential hypertension 11/01/2014  ? ? ?No Known Allergies ? ?Past Surgical History:  ?Procedure  Laterality Date  ? COLONOSCOPY  2011  ? [facility]- polyps- repeat in 7  ? COLONOSCOPY WITH PROPOFOL N/A 05/31/2015  ? Procedure: COLONOSCOPY WITH PROPOFOL;  Surgeon: Lucilla Lame, MD;  Location: [place];  Service: Endoscopy;   Laterality: N/A;  ? COLONOSCOPY WITH PROPOFOL N/A 07/01/2018  ? Procedure: COLONOSCOPY WITH BIOPSY;  Surgeon: Lucilla Lame, MD;  Location: [place];  Service: Endoscopy;  Laterality: N/A;  ? POLYPECTOMY  05/31/2015  ? Procedure: POLYPECTOMY;  Surgeon: Lucilla Lame, MD;  Location: [place];  Service: Endoscopy;;  ? POLYPECTOMY N/A 07/01/2018  ? Procedure: POLYPECTOMY;  Surgeon: Lucilla Lame, MD;  Location: [place];  Service: Endoscopy;  Laterality: N/A;  ? ? ?Social History  ? ?Tobacco Use  ? Smoking status: Never  ? Smokeless tobacco: Never  ?Vaping Use  ? Vaping Use: Never used  ?Substance Use Topics  ? Alcohol use: Yes  ?  Alcohol/week: 0.0 standard drinks  ?  Comment: 1-2 drinks/month  ? Drug use: No  ? ? ? ?Medication list has been reviewed and updated. ? ?Current Meds  ?Medication Sig  ? aspirin 81 MG tablet Take 1 tablet (81 mg total) by mouth daily.  ? atorvastatin (LIPITOR) 10 MG tablet TAKE 1 TABLET DAILY  ? hydrochlorothiazide (HYDRODIURIL) 12.5 MG tablet TAKE 1 TABLET DAILY  ? ibuprofen (ADVIL) 800 MG tablet TAKE 1 TABLET (800 MG TOTAL) BY MOUTH EVERY 8 (EIGHT) HOURS AS NEEDED FOR MODERATE PAIN.  ? loratadine (CLARITIN) 10 MG tablet Take 1 tablet (10 mg total) by mouth daily.  ? sildenafil (VIAGRA) 100 MG tablet Take 1 tablet (100 mg total) by mouth daily as needed for erectile dysfunction.  ? valsartan (DIOVAN) 40 MG tablet Take 1 tablet (40 mg total) by mouth daily.  ? ? ? ?  08/21/2021  ?  8:02 AM 07/31/2020  ?  9:19 AM 01/16/2020  ?  8:08 AM 05/22/2019  ?  8:05 AM  ?GAD 7 : Generalized Anxiety Score  ?Nervous, Anxious, on Edge 0 0 0 0  ?Control/stop worrying 0 0 0 0  ?Worry too much - different things 0 0 0 0  ?Trouble relaxing 0 0 0 0  ?Restless 0 0 0 0  ?Easily annoyed or irritable 0 0 0 0  ?Afraid - awful might happen 0 0 0 0  ?Total GAD 7 Score 0 0 0 0  ?Anxiety Difficulty Not difficult at all     ? ? ? ?  08/21/2021  ?  8:02 AM  ?Depression screen PHQ 2/9  ?Decreased Interest  0  ?Down, Depressed, Hopeless 0  ?PHQ - 2 Score 0  ?Altered sleeping 0  ?Tired, decreased energy 0  ?Change in appetite 0  ?Feeling bad or failure about yourself  0  ?Trouble concentrating 0  ?Moving slowly or fidgety/restless 0  ?Suicidal thoughts 0  ?PHQ-9 Score 0  ?Difficult doing work/chores Not difficult at all  ? ? ?BP Readings from Last 3 Encounters:  ?08/21/21 120/80  ?02/20/21 120/80  ?07/31/20 120/70  ? ? ?Physical Exam ?Vitals and nursing note reviewed.  ?HENT:  ?   Head: Normocephalic.  ?   Right Ear: Tympanic membrane, ear canal and external ear normal.  ?   Left Ear: Tympanic membrane, ear canal and external ear normal.  ?   Nose: Nose normal. No congestion or rhinorrhea.  ?Eyes:  ?   General: No scleral icterus.    ?   Right eye: No discharge.     ?  Left eye: No discharge.  ?   Conjunctiva/sclera: Conjunctivae normal.  ?   Pupils: Pupils are equal, round, and reactive to light.  ?Neck:  ?   Thyroid: No thyromegaly.  ?   Vascular: No JVD.  ?   Trachea: No tracheal deviation.  ?Cardiovascular:  ?   Rate and Rhythm: Normal rate and regular rhythm.  ?   Heart sounds: Normal heart sounds. No murmur heard. ?  No friction rub. No gallop.  ?Pulmonary:  ?   Effort: No respiratory distress.  ?   Breath sounds: Normal breath sounds. No wheezing, rhonchi or rales.  ?Abdominal:  ?   General: Bowel sounds are normal.  ?   Palpations: Abdomen is soft. There is no mass.  ?   Tenderness: There is no abdominal tenderness. There is no guarding or rebound.  ?Musculoskeletal:     ?   General: No tenderness. Normal range of motion.  ?   Cervical back: Normal range of motion and neck supple.  ?Lymphadenopathy:  ?   Cervical: No cervical adenopathy.  ?Skin: ?   General: Skin is warm.  ?   Findings: No rash.  ?Neurological:  ?   Mental Status: He is alert and oriented to person, place, and time.  ?   Cranial Nerves: No cranial nerve deficit.  ?   Deep Tendon Reflexes: Reflexes are normal and symmetric.  ? ? ?Wt Readings  from Last 3 Encounters:  ?08/21/21 233 lb (105.7 kg)  ?02/20/21 231 lb (104.8 kg)  ?07/31/20 238 lb (108 kg)  ? ? ?BP 120/80   Pulse 80   Ht 5' 9.5" (1.765 m)   Wt 233 lb (105.7 kg)   BMI 33.91 kg/m?  ? ?

## 2021-08-21 NOTE — Telephone Encounter (Signed)
CALLED PATIENT NO ANSWER LEFT VOICEMAIL FOR A CALL BACK ? ?

## 2021-08-22 LAB — COMPREHENSIVE METABOLIC PANEL
ALT: 29 IU/L (ref 0–44)
AST: 21 IU/L (ref 0–40)
Albumin/Globulin Ratio: 2.1 (ref 1.2–2.2)
Albumin: 4.6 g/dL (ref 3.8–4.8)
Alkaline Phosphatase: 42 IU/L — ABNORMAL LOW (ref 44–121)
BUN/Creatinine Ratio: 13 (ref 10–24)
BUN: 12 mg/dL (ref 8–27)
Bilirubin Total: 0.5 mg/dL (ref 0.0–1.2)
CO2: 21 mmol/L (ref 20–29)
Calcium: 9.6 mg/dL (ref 8.6–10.2)
Chloride: 101 mmol/L (ref 96–106)
Creatinine, Ser: 0.93 mg/dL (ref 0.76–1.27)
Globulin, Total: 2.2 g/dL (ref 1.5–4.5)
Glucose: 115 mg/dL — ABNORMAL HIGH (ref 70–99)
Potassium: 4.1 mmol/L (ref 3.5–5.2)
Sodium: 139 mmol/L (ref 134–144)
Total Protein: 6.8 g/dL (ref 6.0–8.5)
eGFR: 93 mL/min/{1.73_m2} (ref >59)

## 2021-08-22 LAB — LIPID PANEL WITH LDL/HDL RATIO
Cholesterol, Total: 180 mg/dL (ref 100–199)
HDL: 50 mg/dL (ref >39)
LDL Chol Calc (NIH): 114 mg/dL — ABNORMAL HIGH (ref 0–99)
LDL/HDL Ratio: 2.3 ratio (ref 0.0–3.6)
Triglycerides: 85 mg/dL (ref 0–149)
VLDL Cholesterol Cal: 16 mg/dL (ref 5–40)

## 2021-08-22 LAB — HEMOGLOBIN A1C
Est. average glucose Bld gHb Est-mCnc: 123 mg/dL
Hgb A1c MFr Bld: 5.9 % — ABNORMAL HIGH (ref 4.8–5.6)

## 2021-08-25 ENCOUNTER — Telehealth: Payer: Self-pay

## 2021-08-25 NOTE — Telephone Encounter (Signed)
CALLED PATIENT NO ANSWER LEFT VOICEMAIL FOR A CALL BACK °Letter sent °

## 2021-08-25 NOTE — Telephone Encounter (Signed)
CALLED PATIENT NO ANSWER LEFT VOICEMAIL FOR A CALL BACK ? ?

## 2021-09-04 ENCOUNTER — Telehealth: Payer: Self-pay | Admitting: Family Medicine

## 2021-09-04 ENCOUNTER — Other Ambulatory Visit: Payer: Self-pay

## 2021-09-04 ENCOUNTER — Telehealth: Payer: Self-pay

## 2021-09-04 DIAGNOSIS — Z1211 Encounter for screening for malignant neoplasm of colon: Secondary | ICD-10-CM

## 2021-09-04 DIAGNOSIS — Z86010 Personal history of colonic polyps: Secondary | ICD-10-CM

## 2021-09-04 MED ORDER — NA SULFATE-K SULFATE-MG SULF 17.5-3.13-1.6 GM/177ML PO SOLN: 1.0000 | Freq: Once | ORAL | 0 refills | Status: AC

## 2021-09-04 NOTE — Telephone Encounter (Signed)
Called pt left VM stating that Dr. [name] office tried to call the patient 3 times and referral was closed. Another referral will be placed. Pt name was stated VM. ? ?KP ?

## 2021-09-04 NOTE — Telephone Encounter (Signed)
Patient requesting to speak with [name], patient was following up on his GI referral Patient would like to have his colonoscopy with his GI Dr. [name] at [facility] Patient would like a follow up call once referral is placed.  ?

## 2021-09-04 NOTE — Telephone Encounter (Signed)
Pt left message returning call to schedule colonoscopy ?

## 2021-09-04 NOTE — Telephone Encounter (Signed)
Gastroenterology Pre-Procedure Review ? ?Request Date: 10/02/21 ?Requesting Physician: Dr. [name] ? ?PATIENT REVIEW QUESTIONS: The patient responded to the following health history questions as indicated:   ? ?1. Are you having any GI issues? no ?2. Do you have a personal history of Polyps? yes (Dr. [name] performed las t colonoscopy 07/01/18) ?3. Do you have a family history of Colon Cancer or Polyps? no ?4. Diabetes Mellitus? no ?5. Joint replacements in the past 12 months?no ?6. Major health problems in the past 3 months?no ?7. Any artificial heart valves, MVP, or defibrillator?no ?   ?MEDICATIONS & ALLERGIES:    ?Patient reports the following regarding taking any anticoagulation/antiplatelet therapy:   ?Plavix, Coumadin, Eliquis, Xarelto, Lovenox, Pradaxa, Brilinta, or Effient? no ?Aspirin? 81 mg aspirin ? ?Patient confirms/reports the following medications:  ?Current Outpatient Medications  ?Medication Sig Dispense Refill  ? aspirin 81 MG tablet Take 1 tablet (81 mg total) by mouth daily. 30 tablet 11  ? atorvastatin (LIPITOR) 10 MG tablet Take 1 tablet (10 mg total) by mouth daily. 90 tablet 1  ? hydrochlorothiazide (HYDRODIURIL) 12.5 MG tablet TAKE 1 TABLET DAILY 90 tablet 1  ? ibuprofen (ADVIL) 800 MG tablet Take 1 tablet (800 mg total) by mouth every 8 (eight) hours as needed for moderate pain. 90 tablet 5  ? loratadine (CLARITIN) 10 MG tablet Take 1 tablet (10 mg total) by mouth daily. 90 tablet 1  ? sildenafil (VIAGRA) 100 MG tablet Take 1 tablet (100 mg total) by mouth daily as needed for erectile dysfunction. 12 tablet 11  ? valsartan (DIOVAN) 40 MG tablet Take 1 tablet (40 mg total) by mouth daily. 90 tablet 1  ? ?No current facility-administered medications for this visit.  ? ? ?Patient confirms/reports the following allergies:  ?No Known Allergies ? ?No orders of the defined types were placed in this encounter. ? ? ?AUTHORIZATION INFORMATION ?Primary Insurance: ?1D#: ?Group #: ? ?Secondary  Insurance: ?1D#: ?Group #: ? ?SCHEDULE INFORMATION: ?Date: 10/02/21 ?Time: ?Location: [place] ?

## 2021-09-05 ENCOUNTER — Other Ambulatory Visit: Payer: Self-pay

## 2021-09-05 ENCOUNTER — Telehealth: Payer: Self-pay | Admitting: Family Medicine

## 2021-09-05 DIAGNOSIS — Z1211 Encounter for screening for malignant neoplasm of colon: Secondary | ICD-10-CM

## 2021-09-05 NOTE — Telephone Encounter (Signed)
?  Pt is calling to report that Dr. [name] is not in network for his colonoscopy. Can a new referral be placed with a Dr. Parks Ranger is in network with his insurance- [employer] through [employer]. ? ?[phone] ? ?

## 2021-09-05 NOTE — Telephone Encounter (Signed)
Informed pt that referral has been placed. Could take 7-10 business days to get a phone call to schedule. ? ?KP ?

## 2021-09-15 ENCOUNTER — Telehealth: Payer: Self-pay

## 2021-09-15 NOTE — Telephone Encounter (Signed)
CALLED PATIENT NO ANSWER LEFT VOICEMAIL FOR A CALL BACK ? ?

## 2021-09-16 ENCOUNTER — Telehealth: Payer: Self-pay

## 2021-09-16 NOTE — Telephone Encounter (Unsigned)
Copied from [place] [phone]. Topic: Referral - Request for Referral >> Sep 16, 2021 11:14 AM Erick Blinks wrote: Has patient seen PCP for this complaint? Yes.   *If NO, is insurance requiring patient see PCP for this issue before PCP can refer them? Referral for which specialty: GI  Preferred provider/office: [facility] [employer], has [employer]  Reason for referral: Needs a colonoscopy

## 2021-09-16 NOTE — Telephone Encounter (Signed)
Spoke to pt on phone and sent Lexine Baton a message to forward referral for GI to [facility] instead of [facility]. Told pt to call me next week if he hasn't heard from the appt

## 2021-09-16 NOTE — Telephone Encounter (Signed)
Called patient back he was calling to let us know dr [name] not in his network and he was going to call insurance company to see how much everything would cost and call us back after he decides what to do

## 2021-09-18 ENCOUNTER — Other Ambulatory Visit: Payer: Self-pay

## 2021-09-18 ENCOUNTER — Encounter: Payer: Self-pay | Admitting: Gastroenterology

## 2021-09-18 MED ORDER — NA SULFATE-K SULFATE-MG SULF 17.5-3.13-1.6 GM/177ML PO SOLN: 1.0000 | Freq: Once | ORAL | 0 refills | Status: AC

## 2021-10-02 ENCOUNTER — Other Ambulatory Visit: Payer: Self-pay

## 2021-10-02 ENCOUNTER — Encounter
Admission: RE | Disposition: A | Discharge status: Home / Self Care | Payer: Self-pay | Source: Home / Self Care | Attending: Gastroenterology

## 2021-10-02 ENCOUNTER — Ambulatory Visit: Payer: 59 | Admitting: Anesthesiology

## 2021-10-02 ENCOUNTER — Ambulatory Visit
Admission: RE | Admit: 2021-10-02 | Discharge: 2021-10-02 | Disposition: A | Discharge status: Home / Self Care | Payer: 59 | Attending: Gastroenterology | Admitting: Gastroenterology

## 2021-10-02 ENCOUNTER — Encounter: Payer: Self-pay | Admitting: Gastroenterology

## 2021-10-02 DIAGNOSIS — Z1211 Encounter for screening for malignant neoplasm of colon: Secondary | ICD-10-CM | POA: Insufficient documentation

## 2021-10-02 DIAGNOSIS — Z86010 Personal history of colonic polyps: Secondary | ICD-10-CM | POA: Insufficient documentation

## 2021-10-02 DIAGNOSIS — K5730 Diverticulosis of large intestine without perforation or abscess without bleeding: Secondary | ICD-10-CM | POA: Insufficient documentation

## 2021-10-02 DIAGNOSIS — Z01818 Encounter for other preprocedural examination: Secondary | ICD-10-CM | POA: Diagnosis not present

## 2021-10-02 DIAGNOSIS — D122 Benign neoplasm of ascending colon: Secondary | ICD-10-CM | POA: Insufficient documentation

## 2021-10-02 DIAGNOSIS — M1990 Unspecified osteoarthritis, unspecified site: Secondary | ICD-10-CM | POA: Insufficient documentation

## 2021-10-02 DIAGNOSIS — K635 Polyp of colon: Secondary | ICD-10-CM | POA: Diagnosis not present

## 2021-10-02 DIAGNOSIS — I10 Essential (primary) hypertension: Secondary | ICD-10-CM | POA: Insufficient documentation

## 2021-10-02 HISTORY — PX: POLYPECTOMY: SHX5525

## 2021-10-02 HISTORY — PX: COLONOSCOPY WITH PROPOFOL: SHX5780

## 2021-10-02 HISTORY — DX: Unspecified osteoarthritis, unspecified site: M19.90

## 2021-10-02 SURGERY — COLONOSCOPY WITH PROPOFOL
Anesthesia: General | Site: Rectum

## 2021-10-02 MED ORDER — SODIUM CHLORIDE 0.9 % IV SOLN: INTRAVENOUS | Status: DC

## 2021-10-02 MED ORDER — LACTATED RINGERS IV SOLN: INTRAVENOUS | Status: DC

## 2021-10-02 MED ORDER — STERILE WATER FOR IRRIGATION IR SOLN
Status: DC | PRN
  Administered 2021-10-02: 100 mL

## 2021-10-02 MED ORDER — PROPOFOL 10 MG/ML IV BOLUS
INTRAVENOUS | Status: DC | PRN
  Administered 2021-10-02 (×3): 30 mg via INTRAVENOUS
  Administered 2021-10-02: 100 mg via INTRAVENOUS
  Administered 2021-10-02: 70 mg via INTRAVENOUS

## 2021-10-02 MED ORDER — LIDOCAINE HCL (CARDIAC) PF 100 MG/5ML IV SOSY
PREFILLED_SYRINGE | INTRAVENOUS | Status: DC | PRN
  Administered 2021-10-02: 40 mg via INTRAVENOUS

## 2021-10-02 SURGICAL SUPPLY — 8 items
GOWN CVR UNV OPN BCK APRN NK (MISCELLANEOUS) ×2 IMPLANT
GOWN ISOL THUMB LOOP REG UNIV (MISCELLANEOUS) ×4
KIT PRC NS LF DISP ENDO (KITS) ×1 IMPLANT
KIT PROCEDURE OLYMPUS (KITS) ×2
MANIFOLD NEPTUNE II (INSTRUMENTS) ×2 IMPLANT
SNARE COLD EXACTO (MISCELLANEOUS) ×1 IMPLANT
TRAP ETRAP POLY (MISCELLANEOUS) ×1 IMPLANT
WATER STERILE IRR 250ML POUR (IV SOLUTION) ×2 IMPLANT

## 2021-10-02 NOTE — Anesthesia Preprocedure Evaluation (Signed)
Anesthesia Evaluation  Patient identified by MRN, date of birth, ID band Patient awake    Reviewed: Allergy & Precautions, NPO status , Patient's Chart, lab work & pertinent test results, reviewed documented beta blocker date and time   History of Anesthesia Complications Negative for: history of anesthetic complications  Airway Mallampati: III  TM Distance: >3 FB Neck ROM: Full    Dental   Pulmonary    breath sounds clear to auscultation       Cardiovascular hypertension, (-) angina(-) DOE  Rhythm:Regular Rate:Normal     Neuro/Psych    GI/Hepatic neg GERD  ,  Endo/Other    Renal/GU      Musculoskeletal  (+) Arthritis ,   Abdominal   Peds  Hematology   Anesthesia Other Findings   Reproductive/Obstetrics                            Anesthesia Physical Anesthesia Plan  ASA: 2  Anesthesia Plan: General   Post-op Pain Management:    Induction: Intravenous  PONV Risk Score and Plan: 2 and Propofol infusion, TIVA and Treatment may vary due to age or medical condition  Airway Management Planned: Natural Airway and Nasal Cannula  Additional Equipment:   Intra-op Plan:   Post-operative Plan:   Informed Consent: I have reviewed the patients History and Physical, chart, labs and discussed the procedure including the risks, benefits and alternatives for the proposed anesthesia with the patient or authorized representative who has indicated his/her understanding and acceptance.       Plan Discussed with: CRNA and Anesthesiologist  Anesthesia Plan Comments:         Anesthesia Quick Evaluation

## 2021-10-02 NOTE — Op Note (Signed)
[facility] Patient Name: Ryan Parks Procedure Date: 10/02/2021 8:30 AM MRN: [id number] Account #: [id number] Date of Birth: 03/14/1961 Admit Type: Outpatient Age: 61 Room: [facility] OR ROOM 01 Gender: Male Note Status: Finalized Instrument Name: [id number] Procedure:             Colonoscopy Indications:           High risk colon cancer surveillance: Personal history                         of colonic polyps Providers:             Lucilla Lame MD, MD Referring MD:          Juline Patch, MD (Referring MD) Medicines:             Propofol per Anesthesia Complications:         No immediate complications. Procedure:             Pre-Anesthesia Assessment:                        - Prior to the procedure, a History and Physical was                         performed, and patient medications and allergies were                         reviewed. The patient's tolerance of previous                         anesthesia was also reviewed. The risks and benefits                         of the procedure and the sedation options and risks                         were discussed with the patient. All questions were                         answered, and informed consent was obtained. Prior                         Anticoagulants: The patient has taken no previous                         anticoagulant or antiplatelet agents. ASA Grade                         Assessment: II - A patient with mild systemic disease.                         After reviewing the risks and benefits, the patient                         was deemed in satisfactory condition to undergo the                         procedure.  After obtaining informed consent, the colonoscope was                         passed under direct vision. Throughout the procedure,                         the patient's blood pressure, pulse, and oxygen                         saturations were monitored  continuously. The                         Colonoscope was introduced through the anus and                         advanced to the the cecum, identified by appendiceal                         orifice and ileocecal valve. The colonoscopy was                         performed without difficulty. The patient tolerated                         the procedure well. The quality of the bowel                         preparation was excellent. Findings:      The perianal and digital rectal examinations were normal.      A 5 mm polyp was found in the ascending colon. The polyp was sessile.       The polyp was removed with a cold snare. Resection and retrieval were       complete.      A 4 mm polyp was found in the sigmoid colon. The polyp was sessile. The       polyp was removed with a cold snare. Resection and retrieval were       complete.      Multiple small-mouthed diverticula were found in the sigmoid colon. Impression:            - One 5 mm polyp in the ascending colon, removed with                         a cold snare. Resected and retrieved.                        - One 4 mm polyp in the sigmoid colon, removed with a                         cold snare. Resected and retrieved.                        - Diverticulosis in the sigmoid colon. Recommendation:        - Discharge patient to home.                        - Resume previous diet.                        -  Continue present medications.                        - Await pathology results.                        - Repeat colonoscopy in 5 years for surveillance. Procedure Code(s):     --- Professional ---                        [phone], Colonoscopy, flexible; with removal of                         tumor(s), polyp(s), or other lesion(s) by snare                         technique Diagnosis Code(s):     --- Professional ---                        Z86.010, Personal history of colonic polyps                        K63.5, Polyp of colon CPT  copyright 2019 American Medical Association. All rights reserved. The codes documented in this report are preliminary and upon coder review may  be revised to meet current compliance requirements. Lucilla Lame MD, MD 10/02/2021 8:50:53 AM This report has been signed electronically. Number of Addenda: 0 Note Initiated On: 10/02/2021 8:30 AM Scope Withdrawal Time: 0 hours 9 minutes 31 seconds  Total Procedure Duration: 0 hours 12 minutes 1 second  Estimated Blood Loss:  Estimated blood loss: none.      [facility]

## 2021-10-02 NOTE — Anesthesia Postprocedure Evaluation (Signed)
Anesthesia Post Note  Patient: Ryan Parks  Procedure(s) Performed: COLONOSCOPY WITH BIOPSY (Rectum) POLYPECTOMY (Rectum)     Patient location during evaluation: PACU Anesthesia Type: General Level of consciousness: awake and alert Pain management: pain level controlled Vital Signs Assessment: post-procedure vital signs reviewed and stable Respiratory status: spontaneous breathing, nonlabored ventilation, respiratory function stable and patient connected to nasal cannula oxygen Cardiovascular status: blood pressure returned to baseline and stable Postop Assessment: no apparent nausea or vomiting Anesthetic complications: no   No notable events documented.  Joanna Hall A  Tommye Lehenbauer

## 2021-10-02 NOTE — Transfer of Care (Signed)
Immediate Anesthesia Transfer of Care Note  Patient: Ryan Parks  Procedure(s) Performed: COLONOSCOPY WITH BIOPSY (Rectum) POLYPECTOMY (Rectum)  Patient Location: PACU  Anesthesia Type: General  Level of Consciousness: awake, alert  and patient cooperative  Airway and Oxygen Therapy: Patient Spontanous Breathing and Patient connected to supplemental oxygen  Post-op Assessment: Post-op Vital signs reviewed, Patient's Cardiovascular Status Stable, Respiratory Function Stable, Patent Airway and No signs of Nausea or vomiting  Post-op Vital Signs: Reviewed and stable  Complications: No notable events documented.

## 2021-10-02 NOTE — H&P (Signed)
Lucilla Lame, MD [address] [place], [place] [address] Phone:[phone] Fax : [phone]  Primary Care Physician:  Juline Patch, MD Primary Gastroenterologist:  Dr. [name]  Pre-Procedure History & Physical: HPI:  Ryan Parks is a 61 y.o. male is here for an colonoscopy.   Past Medical History:  Diagnosis Date   Arthritis    Erectile dysfunction    Hypertension     Past Surgical History:  Procedure Laterality Date   COLONOSCOPY  2011   [facility]- polyps- repeat in 7   COLONOSCOPY WITH PROPOFOL N/A 05/31/2015   Procedure: COLONOSCOPY WITH PROPOFOL;  Surgeon: Lucilla Lame, MD;  Location: [place];  Service: Endoscopy;  Laterality: N/A;   COLONOSCOPY WITH PROPOFOL N/A 07/01/2018   Procedure: COLONOSCOPY WITH BIOPSY;  Surgeon: Lucilla Lame, MD;  Location: [place];  Service: Endoscopy;  Laterality: N/A;   POLYPECTOMY  05/31/2015   Procedure: POLYPECTOMY;  Surgeon: Lucilla Lame, MD;  Location: [place];  Service: Endoscopy;;   POLYPECTOMY N/A 07/01/2018   Procedure: POLYPECTOMY;  Surgeon: Lucilla Lame, MD;  Location: [place];  Service: Endoscopy;  Laterality: N/A;    Prior to Admission medications   Medication Sig Start Date End Date Taking? Authorizing Provider  aspirin 81 MG tablet Take 1 tablet (81 mg total) by mouth daily. 03/13/16  Yes Juline Patch, MD  atorvastatin (LIPITOR) 10 MG tablet Take 1 tablet (10 mg total) by mouth daily. 08/21/21  Yes Juline Patch, MD  hydrochlorothiazide (HYDRODIURIL) 12.5 MG tablet TAKE 1 TABLET DAILY 08/21/21  Yes Juline Patch, MD  ibuprofen (ADVIL) 800 MG tablet Take 1 tablet (800 mg total) by mouth every 8 (eight) hours as needed for moderate pain. 08/21/21  Yes Juline Patch, MD  loratadine (CLARITIN) 10 MG tablet Take 1 tablet (10 mg total) by mouth daily. 08/21/21  Yes Juline Patch, MD  sildenafil (VIAGRA) 100 MG tablet Take 1 tablet (100 mg total) by mouth daily  as needed for erectile dysfunction. 01/16/20  Yes Juline Patch, MD  valsartan (DIOVAN) 40 MG tablet Take 1 tablet (40 mg total) by mouth daily. 08/21/21  Yes Juline Patch, MD  fluticasone (FLONASE) 50 MCG/ACT nasal spray Place 2 sprays into both nostrils daily. 05/31/17 04/27/19  Juline Patch, MD    Allergies as of 09/04/2021   (No Known Allergies)    Family History  Problem Relation Age of Onset   Healthy Mother    Healthy Father     Social History   Socioeconomic History   Marital status: Married    Spouse name: Not on file   Number of children: Not on file   Years of education: Not on file   Highest education level: Not on file  Occupational History   Not on file  Tobacco Use   Smoking status: Never   Smokeless tobacco: Never  Vaping Use   Vaping Use: Never used  Substance and Sexual Activity   Alcohol use: Yes    Alcohol/week: 0.0 standard drinks of alcohol    Comment: 1-2 drinks/month   Drug use: No   Sexual activity: Yes  Other Topics Concern   Not on file  Social History Narrative   Not on file   Social Determinants of Health   Financial Resource Strain: Not on file  Food Insecurity: Not on file  Transportation Needs: Not on file  Physical Activity: Not on file  Stress: Not on file  Social Connections: Not on file  Intimate Partner Violence: Not on file    Review of Systems: See HPI, otherwise negative ROS  Physical Exam: BP 133/86   Pulse 63   Temp 97.9 F (36.6 C) (Temporal)   Resp 15   Ht 5' 9.5" (1.765 m)   Wt 102.1 kg   SpO2 98%   BMI 32.75 kg/m  General:   Alert,  pleasant and cooperative in NAD Head:  Normocephalic and atraumatic. Neck:  Supple; no masses or thyromegaly. Lungs:  Clear throughout to auscultation.    Heart:  Regular rate and rhythm. Abdomen:  Soft, nontender and nondistended. Normal bowel sounds, without guarding, and without rebound.   Neurologic:  Alert and  oriented x4;  grossly normal  neurologically.  Impression/Plan: Ryan Parks is here for an colonoscopy to be performed for a history of adenomatous polyps on 2020   Risks, benefits, limitations, and alternatives regarding  colonoscopy have been reviewed with the patient.  Questions have been answered.  All parties agreeable.   Lucilla Lame, MD  10/02/2021, 8:27 AM

## 2021-10-03 ENCOUNTER — Encounter: Payer: Self-pay | Admitting: Gastroenterology

## 2021-10-06 ENCOUNTER — Encounter: Payer: Self-pay | Admitting: Gastroenterology

## 2021-10-06 LAB — SURGICAL PATHOLOGY

## 2022-01-26 ENCOUNTER — Other Ambulatory Visit: Payer: Self-pay | Admitting: Family Medicine

## 2022-01-26 DIAGNOSIS — I10 Essential (primary) hypertension: Secondary | ICD-10-CM

## 2022-01-26 NOTE — Telephone Encounter (Signed)
Requested Prescriptions  Pending Prescriptions Disp Refills  . valsartan (DIOVAN) 40 MG tablet [Pharmacy Med Name: VALSARTAN TABS '40MG'$ ] 90 tablet 0    Sig: TAKE 1 TABLET DAILY     Cardiovascular:  Angiotensin Receptor Blockers Failed - 01/26/2022 12:54 AM      Failed - Last BP in normal range    BP Readings from Last 1 Encounters:  10/02/21 (!) 119/91         Passed - Cr in normal range and within 180 days    Creatinine, Ser  Date Value Ref Range Status  08/21/2021 0.93 0.76 - 1.27 mg/dL Final         Passed - K in normal range and within 180 days    Potassium  Date Value Ref Range Status  08/21/2021 4.1 3.5 - 5.2 mmol/L Final         Passed - Patient is not pregnant      Passed - Valid encounter within last 6 months    Recent Outpatient Visits          5 months ago Essential hypertension   [facility] [place], Ida, MD   11 months ago Essential hypertension   [place] [facility] [place], Deanna C, MD   1 year ago Essential hypertension   [facility] [place], Deanna C, MD   2 years ago Essential hypertension   [facility] [place], Deanna C, MD   2 years ago Essential hypertension   [place] [facility] [place], Christie, MD      Future Appointments            In 3 weeks Juline Patch, MD [facility]

## 2022-02-20 ENCOUNTER — Ambulatory Visit: Payer: 59 | Admitting: Family Medicine

## 2022-03-02 ENCOUNTER — Other Ambulatory Visit: Payer: Self-pay | Admitting: Family Medicine

## 2022-03-02 DIAGNOSIS — I10 Essential (primary) hypertension: Secondary | ICD-10-CM

## 2022-03-06 ENCOUNTER — Encounter: Payer: Self-pay | Admitting: Family Medicine

## 2022-03-06 ENCOUNTER — Ambulatory Visit: Payer: 59 | Admitting: Family Medicine

## 2022-03-06 VITALS — BP 120/78 | HR 80 | Ht 69.5 in | Wt 235.0 lb

## 2022-03-06 DIAGNOSIS — I10 Essential (primary) hypertension: Secondary | ICD-10-CM | POA: Diagnosis not present

## 2022-03-06 DIAGNOSIS — R7303 Prediabetes: Secondary | ICD-10-CM | POA: Diagnosis not present

## 2022-03-06 DIAGNOSIS — N529 Male erectile dysfunction, unspecified: Secondary | ICD-10-CM

## 2022-03-06 DIAGNOSIS — E782 Mixed hyperlipidemia: Secondary | ICD-10-CM

## 2022-03-06 DIAGNOSIS — J301 Allergic rhinitis due to pollen: Secondary | ICD-10-CM

## 2022-03-06 MED ORDER — VALSARTAN 40 MG PO TABS: 40.0000 mg | ORAL_TABLET | Freq: Every day | ORAL | 1 refills | Status: DC

## 2022-03-06 MED ORDER — HYDROCHLOROTHIAZIDE 12.5 MG PO TABS: ORAL_TABLET | ORAL | 1 refills | Status: DC

## 2022-03-06 MED ORDER — ATORVASTATIN CALCIUM 10 MG PO TABS: 10.0000 mg | ORAL_TABLET | Freq: Every day | ORAL | 1 refills | Status: DC

## 2022-03-06 MED ORDER — LORATADINE 10 MG PO TABS: 10.0000 mg | ORAL_TABLET | Freq: Every day | ORAL | 1 refills | Status: DC

## 2022-03-06 NOTE — Patient Instructions (Signed)

## 2022-03-06 NOTE — Progress Notes (Signed)
Date:  03/06/2022   Name:  Ryan Parks   DOB:  06/28/1960   MRN:  [id number]   Chief Complaint: Prediabetes, Hyperlipidemia, Hypertension, and Allergic Rhinitis   Hyperlipidemia This is a chronic problem. The current episode started more than 1 year ago. The problem is controlled. Recent lipid tests were reviewed and are normal. He has no history of chronic renal disease, diabetes, hypothyroidism, liver disease, obesity or nephrotic syndrome. There are no known factors aggravating his hyperlipidemia. Pertinent negatives include no chest pain, focal sensory loss, focal weakness, leg pain, myalgias or shortness of breath. Current antihyperlipidemic treatment includes statins. The current treatment provides moderate improvement of lipids. There are no compliance problems.  Risk factors for coronary artery disease include dyslipidemia and hypertension.  Hypertension This is a chronic problem. The problem has been gradually improving since onset. The problem is controlled. Pertinent negatives include no chest pain, headaches, neck pain, orthopnea, palpitations, PND, shortness of breath or sweats. There are no associated agents to hypertension. Risk factors for coronary artery disease include dyslipidemia. Past treatments include angiotensin blockers and diuretics. The current treatment provides moderate improvement. There are no compliance problems.  There is no history of angina. There is no history of chronic renal disease, a hypertension causing med or renovascular disease.  Diabetes He presents for his follow-up diabetic visit. He has type 2 diabetes mellitus. His disease course has been stable. Pertinent negatives for hypoglycemia include no dizziness, headaches, nervousness/anxiousness or sweats. Pertinent negatives for diabetes include no chest pain, no polydipsia, no polyuria and no weight loss. Symptoms are stable.    Lab Results  Component Value Date   NA 139 08/21/2021   K 4.1  08/21/2021   CO2 21 08/21/2021   GLUCOSE 115 (H) 08/21/2021   BUN 12 08/21/2021   CREATININE 0.93 08/21/2021   CALCIUM 9.6 08/21/2021   EGFR 93 08/21/2021   GFRNONAA 84 01/16/2020   Lab Results  Component Value Date   CHOL 180 08/21/2021   HDL 50 08/21/2021   LDLCALC 114 (H) 08/21/2021   TRIG 85 08/21/2021   CHOLHDL 4.6 11/16/2018   No results found for: "TSH" Lab Results  Component Value Date   HGBA1C 5.9 (H) 08/21/2021   No results found for: "WBC", "HGB", "HCT", "MCV", "PLT" Lab Results  Component Value Date   ALT 29 08/21/2021   AST 21 08/21/2021   ALKPHOS 42 (L) 08/21/2021   BILITOT 0.5 08/21/2021   No results found for: "25OHVITD2", "25OHVITD3", "VD25OH"   Review of Systems  Constitutional:  Negative for chills, fever and weight loss.  HENT:  Negative for drooling, ear discharge, ear pain and sore throat.   Respiratory:  Negative for cough, shortness of breath and wheezing.   Cardiovascular:  Negative for chest pain, palpitations, orthopnea, leg swelling and PND.  Gastrointestinal:  Negative for abdominal pain, blood in stool, constipation, diarrhea and nausea.  Endocrine: Negative for polydipsia and polyuria.  Genitourinary:  Negative for dysuria, frequency, hematuria and urgency.  Musculoskeletal:  Negative for back pain, myalgias and neck pain.  Skin:  Negative for rash.  Allergic/Immunologic: Negative for environmental allergies.  Neurological:  Negative for dizziness, focal weakness and headaches.  Hematological:  Does not bruise/bleed easily.  Psychiatric/Behavioral:  Negative for suicidal ideas. The patient is not nervous/anxious.     Patient Active Problem List   Diagnosis Date Noted   Preop testing    Polyp of ascending colon    Polyp of sigmoid colon  Benign neoplasm of cecum    Erectile dysfunction due to arterial insufficiency 03/13/2016   Erectile dysfunction 03/13/2016   Personal history of colonic polyps    Benign neoplasm of ascending  colon    Benign neoplasm of descending colon    Benign neoplasm of sigmoid colon    Essential hypertension 11/01/2014    No Known Allergies  Past Surgical History:  Procedure Laterality Date   COLONOSCOPY  2011   [facility]- polyps- repeat in 7   COLONOSCOPY WITH PROPOFOL N/A 05/31/2015   Procedure: COLONOSCOPY WITH PROPOFOL;  Surgeon: Lucilla Lame, MD;  Location: [place];  Service: Endoscopy;  Laterality: N/A;   COLONOSCOPY WITH PROPOFOL N/A 07/01/2018   Procedure: COLONOSCOPY WITH BIOPSY;  Surgeon: Lucilla Lame, MD;  Location: [place];  Service: Endoscopy;  Laterality: N/A;   COLONOSCOPY WITH PROPOFOL N/A 10/02/2021   Procedure: COLONOSCOPY WITH BIOPSY;  Surgeon: Lucilla Lame, MD;  Location: [place];  Service: Endoscopy;  Laterality: N/A;   POLYPECTOMY  05/31/2015   Procedure: POLYPECTOMY;  Surgeon: Lucilla Lame, MD;  Location: [place];  Service: Endoscopy;;   POLYPECTOMY N/A 07/01/2018   Procedure: POLYPECTOMY;  Surgeon: Lucilla Lame, MD;  Location: [place];  Service: Endoscopy;  Laterality: N/A;   POLYPECTOMY N/A 10/02/2021   Procedure: POLYPECTOMY;  Surgeon: Lucilla Lame, MD;  Location: [place] [facility] [place];  Service: Endoscopy;  Laterality: N/A;    Social History   Tobacco Use   Smoking status: Never   Smokeless tobacco: Never  Vaping Use   Vaping Use: Never used  Substance Use Topics   Alcohol use: Yes    Alcohol/week: 0.0 standard drinks of alcohol    Comment: 1-2 drinks/month   Drug use: No     Medication list has been reviewed and updated.  Current Meds  Medication Sig   aspirin 81 MG tablet Take 1 tablet (81 mg total) by mouth daily.   atorvastatin (LIPITOR) 10 MG tablet Take 1 tablet (10 mg total) by mouth daily.   hydrochlorothiazide (HYDRODIURIL) 12.5 MG tablet TAKE 1 TABLET DAILY   ibuprofen (ADVIL) 800 MG tablet Take 1 tablet (800 mg total) by mouth every 8 (eight) hours as needed for moderate pain.    loratadine (CLARITIN) 10 MG tablet Take 1 tablet (10 mg total) by mouth daily.   valsartan (DIOVAN) 40 MG tablet TAKE 1 TABLET DAILY   [DISCONTINUED] fluticasone (FLONASE) 50 MCG/ACT nasal spray Place 2 sprays into both nostrils daily.       03/06/2022    9:43 AM 08/21/2021    8:02 AM 07/31/2020    9:19 AM 01/16/2020    8:08 AM  GAD 7 : Generalized Anxiety Score  Nervous, Anxious, on Edge 0 0 0 0  Control/stop worrying 0 0 0 0  Worry too much - different things 0 0 0 0  Trouble relaxing 0 0 0 0  Restless 0 0 0 0  Easily annoyed or irritable 0 0 0 0  Afraid - awful might happen 0 0 0 0  Total GAD 7 Score 0 0 0 0  Anxiety Difficulty Not difficult at all Not difficult at all         03/06/2022    9:43 AM 08/21/2021    8:02 AM 07/31/2020    9:16 AM  Depression screen PHQ 2/9  Decreased Interest 0 0 0  Down, Depressed, Hopeless 0 0 0  PHQ - 2 Score 0 0 0  Altered sleeping 0 0  0  Tired, decreased energy 0 0 0  Change in appetite 0 0 0  Feeling bad or failure about yourself  0 0 0  Trouble concentrating 0 0 0  Moving slowly or fidgety/restless 0 0 0  Suicidal thoughts 0 0 0  PHQ-9 Score 0 0 0  Difficult doing work/chores Not difficult at all Not difficult at all     BP Readings from Last 3 Encounters:  03/06/22 120/78  10/02/21 (!) 119/91  08/21/21 120/80    Physical Exam Vitals and nursing note reviewed.  HENT:     Head: Normocephalic.     Right Ear: Tympanic membrane and external ear normal.     Left Ear: Tympanic membrane and external ear normal.     Nose: Nose normal.     Mouth/Throat:     Mouth: Mucous membranes are moist.  Eyes:     General: No scleral icterus.       Right eye: No discharge.        Left eye: No discharge.     Conjunctiva/sclera: Conjunctivae normal.     Pupils: Pupils are equal, round, and reactive to light.  Neck:     Thyroid: No thyromegaly.     Vascular: No JVD.     Trachea: No tracheal deviation.  Cardiovascular:     Rate and Rhythm:  Normal rate and regular rhythm.     Heart sounds: Normal heart sounds. No murmur heard.    No friction rub. No gallop.  Pulmonary:     Effort: No respiratory distress.     Breath sounds: Normal breath sounds. No wheezing, rhonchi or rales.  Abdominal:     General: Bowel sounds are normal.     Palpations: Abdomen is soft. There is no mass.     Tenderness: There is no abdominal tenderness. There is no guarding or rebound.  Musculoskeletal:        General: No tenderness. Normal range of motion.     Cervical back: Normal range of motion and neck supple.  Lymphadenopathy:     Cervical: No cervical adenopathy.  Skin:    General: Skin is warm.     Findings: No rash.  Neurological:     Mental Status: He is alert.     Deep Tendon Reflexes: Reflexes are normal and symmetric.     Wt Readings from Last 3 Encounters:  03/06/22 235 lb (106.6 kg)  10/02/21 225 lb (102.1 kg)  08/21/21 233 lb (105.7 kg)    BP 120/78   Pulse 80   Ht 5' 9.5" (1.765 m)   Wt 235 lb (106.6 kg)   SpO2 96%   BMI 34.21 kg/m   Assessment and Plan:  1. Essential hypertension Chronic.  Controlled.  Stable.  Asymptomatic.  Tolerating medications well.  Continue hydrochlorothiazide 12.5 mg once a day and valsartan 40 mg once a day.  We will check renal function panel for GFR and electrolytes. - hydrochlorothiazide (HYDRODIURIL) 12.5 MG tablet; TAKE 1 TABLET DAILY  Dispense: 90 tablet; Refill: 1 - valsartan (DIOVAN) 40 MG tablet; Take 1 tablet (40 mg total) by mouth daily.  Dispense: 90 tablet; Refill: 1 - Renal Function Panel  2. Mixed hyperlipidemia Chronic.  Controlled.  Stable.  Continue atorvastatin 10 mg once a day. - atorvastatin (LIPITOR) 10 MG tablet; Take 1 tablet (10 mg total) by mouth daily.  Dispense: 90 tablet; Refill: 1  3. Erectile dysfunction, unspecified erectile dysfunction type Currently stable and does not need medication at this  time.  4. Prediabetes Chronic.  Controlled.  Stable.  Last  A1c 5.9.  Patient is doing well and we will reemphasize nutrition with decreased carbohydrates and limiting concentrated sweets. - HgB A1c  5. Seasonal allergic rhinitis due to pollen Chronic.  Controlled.  Stable.  Continue loratadine 10 mg on an as-needed basis. - loratadine (CLARITIN) 10 MG tablet; Take 1 tablet (10 mg total) by mouth daily.  Dispense: 90 tablet; Refill: 1    Otilio Miu, MD

## 2022-03-07 LAB — HEMOGLOBIN A1C
Est. average glucose Bld gHb Est-mCnc: 117 mg/dL
Hgb A1c MFr Bld: 5.7 % — ABNORMAL HIGH (ref 4.8–5.6)

## 2022-03-07 LAB — RENAL FUNCTION PANEL
Albumin: 4.9 g/dL (ref 3.9–4.9)
BUN/Creatinine Ratio: 9 — ABNORMAL LOW (ref 10–24)
BUN: 8 mg/dL (ref 8–27)
CO2: 27 mmol/L (ref 20–29)
Calcium: 9.8 mg/dL (ref 8.6–10.2)
Chloride: 102 mmol/L (ref 96–106)
Creatinine, Ser: 0.9 mg/dL (ref 0.76–1.27)
Glucose: 106 mg/dL — ABNORMAL HIGH (ref 70–99)
Phosphorus: 3.3 mg/dL (ref 2.8–4.1)
Potassium: 4.5 mmol/L (ref 3.5–5.2)
Sodium: 142 mmol/L (ref 134–144)
eGFR: 97 mL/min/{1.73_m2} (ref >59)

## 2022-03-11 ENCOUNTER — Ambulatory Visit: Payer: 59 | Admitting: Family Medicine

## 2022-06-04 ENCOUNTER — Encounter: Payer: 59 | Admitting: Family Medicine

## 2022-06-19 ENCOUNTER — Ambulatory Visit (INDEPENDENT_AMBULATORY_CARE_PROVIDER_SITE_OTHER): Payer: 59 | Admitting: Family Medicine

## 2022-06-19 ENCOUNTER — Encounter: Payer: Self-pay | Admitting: Family Medicine

## 2022-06-19 VITALS — BP 128/76 | HR 64 | Ht 69.5 in | Wt 238.0 lb

## 2022-06-19 DIAGNOSIS — Z0000 Encounter for general adult medical examination without abnormal findings: Secondary | ICD-10-CM | POA: Diagnosis not present

## 2022-06-19 LAB — HEMOCCULT GUIAC POC 1CARD (OFFICE): Fecal Occult Blood, POC: NEGATIVE

## 2022-06-19 NOTE — Progress Notes (Signed)
Date:  06/19/2022   Name:  Ryan Parks   DOB:  [birth year]   MRN:  [id number]   Chief Complaint: Annual Exam  Patient is a 62 year old male who presents for a comprehensive physical exam. The patient reports the following problems: none. Health maintenance has been reviewed up to date.      Lab Results  Component Value Date   NA 142 03/06/2022   K 4.5 03/06/2022   CO2 27 03/06/2022   GLUCOSE 106 (H) 03/06/2022   BUN 8 03/06/2022   CREATININE 0.90 03/06/2022   CALCIUM 9.8 03/06/2022   EGFR 97 03/06/2022   GFRNONAA 84 01/16/2020   Lab Results  Component Value Date   CHOL 180 08/21/2021   HDL 50 08/21/2021   LDLCALC 114 (H) 08/21/2021   TRIG 85 08/21/2021   CHOLHDL 4.6 11/16/2018   No results found for: "TSH" Lab Results  Component Value Date   HGBA1C 5.7 (H) 03/06/2022   No results found for: "WBC", "HGB", "HCT", "MCV", "PLT" Lab Results  Component Value Date   ALT 29 08/21/2021   AST 21 08/21/2021   ALKPHOS 42 (L) 08/21/2021   BILITOT 0.5 08/21/2021   No results found for: "25OHVITD2", "25OHVITD3", "VD25OH"   Review of Systems  Constitutional:  Negative for chills and fever.  HENT:  Negative for drooling, ear discharge, ear pain, postnasal drip, rhinorrhea, sinus pressure, sinus pain and sore throat.   Respiratory:  Negative for cough, shortness of breath and wheezing.   Cardiovascular:  Negative for chest pain, palpitations and leg swelling.  Gastrointestinal:  Negative for abdominal pain, blood in stool, constipation, diarrhea and nausea.  Endocrine: Negative for polydipsia.  Genitourinary:  Negative for dysuria, frequency, hematuria and urgency.  Musculoskeletal:  Negative for back pain, myalgias and neck pain.  Skin:  Negative for rash.  Allergic/Immunologic: Negative for environmental allergies.  Neurological:  Negative for dizziness and headaches.  Hematological:  Does not bruise/bleed easily.  Psychiatric/Behavioral:  Negative for suicidal  ideas. The patient is not nervous/anxious.     Patient Active Problem List   Diagnosis Date Noted   Preop testing    Polyp of ascending colon    Polyp of sigmoid colon    Benign neoplasm of cecum    Erectile dysfunction due to arterial insufficiency 03/13/2016   Erectile dysfunction 03/13/2016   Personal history of colonic polyps    Benign neoplasm of ascending colon    Benign neoplasm of descending colon    Benign neoplasm of sigmoid colon    Essential hypertension 11/01/2014    No Known Allergies  Past Surgical History:  Procedure Laterality Date   COLONOSCOPY  2011   [facility]- polyps- repeat in 7   COLONOSCOPY WITH PROPOFOL N/A 05/31/2015   Procedure: COLONOSCOPY WITH PROPOFOL;  Surgeon: Lucilla Lame, MD;  Location: [place];  Service: Endoscopy;  Laterality: N/A;   COLONOSCOPY WITH PROPOFOL N/A 07/01/2018   Procedure: COLONOSCOPY WITH BIOPSY;  Surgeon: Lucilla Lame, MD;  Location: [place];  Service: Endoscopy;  Laterality: N/A;   COLONOSCOPY WITH PROPOFOL N/A 10/02/2021   Procedure: COLONOSCOPY WITH BIOPSY;  Surgeon: Lucilla Lame, MD;  Location: [place];  Service: Endoscopy;  Laterality: N/A;   POLYPECTOMY  05/31/2015   Procedure: POLYPECTOMY;  Surgeon: Lucilla Lame, MD;  Location: [place];  Service: Endoscopy;;   POLYPECTOMY N/A 07/01/2018   Procedure: POLYPECTOMY;  Surgeon: Lucilla Lame, MD;  Location: [place];  Service: Endoscopy;  Laterality: N/A;   POLYPECTOMY N/A 10/02/2021   Procedure: POLYPECTOMY;  Surgeon: Lucilla Lame, MD;  Location: [place];  Service: Endoscopy;  Laterality: N/A;    Social History   Tobacco Use   Smoking status: Never   Smokeless tobacco: Never  Vaping Use   Vaping Use: Never used  Substance Use Topics   Alcohol use: Yes    Alcohol/week: 0.0 standard drinks of alcohol    Comment: 1-2 drinks/month   Drug use: No     Medication list has been reviewed and  updated.  Current Meds  Medication Sig   aspirin 81 MG tablet Take 1 tablet (81 mg total) by mouth daily.   atorvastatin (LIPITOR) 10 MG tablet Take 1 tablet (10 mg total) by mouth daily.   hydrochlorothiazide (HYDRODIURIL) 12.5 MG tablet TAKE 1 TABLET DAILY   ibuprofen (ADVIL) 800 MG tablet Take 1 tablet (800 mg total) by mouth every 8 (eight) hours as needed for moderate pain.   loratadine (CLARITIN) 10 MG tablet Take 1 tablet (10 mg total) by mouth daily.   valsartan (DIOVAN) 40 MG tablet Take 1 tablet (40 mg total) by mouth daily.       06/19/2022    9:02 AM 03/06/2022    9:43 AM 08/21/2021    8:02 AM 07/31/2020    9:19 AM  GAD 7 : Generalized Anxiety Score  Nervous, Anxious, on Edge 0 0 0 0  Control/stop worrying 0 0 0 0  Worry too much - different things 0 0 0 0  Trouble relaxing 0 0 0 0  Restless 0 0 0 0  Easily annoyed or irritable 0 0 0 0  Afraid - awful might happen 0 0 0 0  Total GAD 7 Score 0 0 0 0  Anxiety Difficulty Not difficult at all Not difficult at all Not difficult at all        06/19/2022    9:02 AM 03/06/2022    9:43 AM 08/21/2021    8:02 AM  Depression screen PHQ 2/9  Decreased Interest 0 0 0  Down, Depressed, Hopeless 0 0 0  PHQ - 2 Score 0 0 0  Altered sleeping 0 0 0  Tired, decreased energy 0 0 0  Change in appetite 0 0 0  Feeling bad or failure about yourself  0 0 0  Trouble concentrating 0 0 0  Moving slowly or fidgety/restless 0 0 0  Suicidal thoughts 0 0 0  PHQ-9 Score 0 0 0  Difficult doing work/chores Not difficult at all Not difficult at all Not difficult at all    BP Readings from Last 3 Encounters:  06/19/22 128/76  03/06/22 120/78  10/02/21 (!) 119/91    Physical Exam Vitals and nursing note reviewed.  Constitutional:      Appearance: Normal appearance. He is well-groomed.  HENT:     Head: Normocephalic.     Jaw: There is normal jaw occlusion.     Right Ear: Hearing, tympanic membrane, ear canal and external ear normal.      Left Ear: Hearing, tympanic membrane, ear canal and external ear normal.     Nose: Nose normal. No congestion or rhinorrhea.     Mouth/Throat:     Lips: Pink.     Mouth: Mucous membranes are moist.     Tongue: No lesions.     Palate: No mass.     Pharynx: Oropharynx is clear. No oropharyngeal exudate or posterior oropharyngeal erythema.  Eyes:  General: Lids are normal. Vision grossly intact. Gaze aligned appropriately. No scleral icterus.       Right eye: No discharge.        Left eye: No discharge.     Extraocular Movements: Extraocular movements intact.     Conjunctiva/sclera: Conjunctivae normal.     Pupils: Pupils are equal, round, and reactive to light.  Neck:     Thyroid: No thyroid mass, thyromegaly or thyroid tenderness.     Vascular: Normal carotid pulses. No carotid bruit, hepatojugular reflux or JVD.     Trachea: Trachea and phonation normal. No tracheal deviation.  Cardiovascular:     Rate and Rhythm: Normal rate and regular rhythm.     Chest Wall: PMI is not displaced. No thrill.     Pulses: Normal pulses.     Heart sounds: Normal heart sounds, S1 normal and S2 normal. No murmur heard.    No systolic murmur is present.     No diastolic murmur is present.     No friction rub. No gallop. No S3 or S4 sounds.  Pulmonary:     Effort: No respiratory distress.     Breath sounds: Normal breath sounds. No decreased air movement. No decreased breath sounds, wheezing, rhonchi or rales.  Chest:     Chest wall: No tenderness.  Breasts:    Right: Normal. No mass.     Left: Normal. No mass.  Abdominal:     General: Bowel sounds are normal.     Palpations: Abdomen is soft. There is no hepatomegaly, splenomegaly or mass.     Tenderness: There is no abdominal tenderness. There is no guarding or rebound.  Genitourinary:    Prostate: Normal. Not enlarged, not tender and no nodules present.     Rectum: Normal. Guaiac result negative. No mass or tenderness.  Musculoskeletal:         General: No tenderness. Normal range of motion.     Cervical back: Full passive range of motion without pain, normal range of motion and neck supple.     Right lower leg: No edema.     Left lower leg: No edema.  Lymphadenopathy:     Head:     Right side of head: No submental, submandibular or tonsillar adenopathy.     Left side of head: No submental, submandibular or tonsillar adenopathy.     Cervical: No cervical adenopathy.     Right cervical: No superficial, deep or posterior cervical adenopathy.    Left cervical: No superficial, deep or posterior cervical adenopathy.     Upper Body:     Right upper body: No supraclavicular or axillary adenopathy.     Left upper body: No supraclavicular or axillary adenopathy.     Lower Body: No right inguinal adenopathy. No left inguinal adenopathy.  Skin:    General: Skin is warm.     Capillary Refill: Capillary refill takes less than 2 seconds.     Findings: No bruising, erythema or rash.  Neurological:     Mental Status: He is alert and oriented to person, place, and time.     Cranial Nerves: Cranial nerves 2-12 are intact. No cranial nerve deficit.     Sensory: No sensory deficit.     Motor: Motor function is intact.     Coordination: Coordination is intact.     Deep Tendon Reflexes: Reflexes are normal and symmetric.     Reflex Scores:      Tricep reflexes are 2+ on the right  side and 2+ on the left side.      Bicep reflexes are 2+ on the right side and 2+ on the left side.      Brachioradialis reflexes are 2+ on the right side and 2+ on the left side.      Patellar reflexes are 2+ on the right side and 2+ on the left side.      Achilles reflexes are 2+ on the right side and 2+ on the left side. Psychiatric:        Behavior: Behavior is cooperative.     Wt Readings from Last 3 Encounters:  06/19/22 238 lb (108 kg)  03/06/22 235 lb (106.6 kg)  10/02/21 225 lb (102.1 kg)    BP 128/76   Pulse 64   Ht 5' 9.5" (1.765 m)   Wt 238  lb (108 kg)   SpO2 98%   BMI 34.64 kg/m   Assessment and Plan: . 1. Annual physical exam No subjective/objective concerns noted during history of present illness, review of past medical history and medications, review of systems, and physical exam.  Will check PSA and lipid panel with comprehensive metabolic panel for lab details.  Hemoccult was noted to be negative. - PSA - POCT Occult Blood Stool - Lipid Panel With LDL/HDL Ratio - Comprehensive Metabolic Panel (CMET)    Otilio Miu, MD

## 2022-06-20 LAB — LIPID PANEL WITH LDL/HDL RATIO
Cholesterol, Total: 169 mg/dL (ref 100–199)
HDL: 48 mg/dL (ref >39)
LDL Chol Calc (NIH): 102 mg/dL — ABNORMAL HIGH (ref 0–99)
LDL/HDL Ratio: 2.1 ratio (ref 0.0–3.6)
Triglycerides: 107 mg/dL (ref 0–149)
VLDL Cholesterol Cal: 19 mg/dL (ref 5–40)

## 2022-06-20 LAB — COMPREHENSIVE METABOLIC PANEL
ALT: 25 IU/L (ref 0–44)
AST: 19 IU/L (ref 0–40)
Albumin/Globulin Ratio: 2.3 — ABNORMAL HIGH (ref 1.2–2.2)
Albumin: 4.6 g/dL (ref 3.9–4.9)
Alkaline Phosphatase: 40 IU/L — ABNORMAL LOW (ref 44–121)
BUN/Creatinine Ratio: 12 (ref 10–24)
BUN: 11 mg/dL (ref 8–27)
Bilirubin Total: 0.6 mg/dL (ref 0.0–1.2)
CO2: 24 mmol/L (ref 20–29)
Calcium: 9.2 mg/dL (ref 8.6–10.2)
Chloride: 104 mmol/L (ref 96–106)
Creatinine, Ser: 0.9 mg/dL (ref 0.76–1.27)
Globulin, Total: 2 g/dL (ref 1.5–4.5)
Glucose: 113 mg/dL — ABNORMAL HIGH (ref 70–99)
Potassium: 4.6 mmol/L (ref 3.5–5.2)
Sodium: 142 mmol/L (ref 134–144)
Total Protein: 6.6 g/dL (ref 6.0–8.5)
eGFR: 96 mL/min/{1.73_m2} (ref >59)

## 2022-06-20 LAB — PSA: Prostate Specific Ag, Serum: 1.2 ng/mL (ref 0.0–4.0)

## 2022-08-27 ENCOUNTER — Other Ambulatory Visit: Payer: Self-pay | Admitting: Family Medicine

## 2022-08-27 DIAGNOSIS — I10 Essential (primary) hypertension: Secondary | ICD-10-CM

## 2022-08-27 NOTE — Telephone Encounter (Signed)
Requested Prescriptions  Pending Prescriptions Disp Refills   hydrochlorothiazide (HYDRODIURIL) 12.5 MG tablet [Pharmacy Med Name: HYDROCHLOROTHIAZIDE TABS 12.5MG ] 90 tablet 1    Sig: TAKE 1 TABLET DAILY     Cardiovascular: Diuretics - Thiazide Passed - 08/27/2022 12:41 AM      Passed - Cr in normal range and within 180 days    Creatinine, Ser  Date Value Ref Range Status  06/19/2022 0.90 0.76 - 1.27 mg/dL Final         Passed - K in normal range and within 180 days    Potassium  Date Value Ref Range Status  06/19/2022 4.6 3.5 - 5.2 mmol/L Final         Passed - Na in normal range and within 180 days    Sodium  Date Value Ref Range Status  06/19/2022 142 134 - 144 mmol/L Final         Passed - Last BP in normal range    BP Readings from Last 1 Encounters:  06/19/22 128/76         Passed - Valid encounter within last 6 months    Recent Outpatient Visits           2 months ago Annual physical exam   [place] [facility]   5 months ago Essential hypertension   [place] [facility]   1 year ago Essential hypertension   [place] [facility]   1 year ago Essential hypertension   [place] [facility]   2 years ago Essential hypertension   [place] [facility]       Future Appointments             In 1 week Duanne Limerick, MD [facility] at [facility], [facility]

## 2022-09-02 ENCOUNTER — Other Ambulatory Visit: Payer: Self-pay | Admitting: Family Medicine

## 2022-09-02 DIAGNOSIS — J301 Allergic rhinitis due to pollen: Secondary | ICD-10-CM

## 2022-09-02 NOTE — Telephone Encounter (Signed)
Requested Prescriptions  Pending Prescriptions Disp Refills   loratadine (CLARITIN) 10 MG tablet [Pharmacy Med Name: LORATADINE TABS-OTC 10MG ] 90 tablet 0    Sig: TAKE 1 TABLET DAILY     Ear, Nose, and Throat:  Antihistamines 2 Passed - 09/02/2022 12:41 AM      Passed - Cr in normal range and within 360 days    Creatinine, Ser  Date Value Ref Range Status  06/19/2022 0.90 0.76 - 1.27 mg/dL Final         Passed - Valid encounter within last 12 months    Recent Outpatient Visits           2 months ago Annual physical exam   [place] [facility]   6 months ago Essential hypertension   [place] [facility]   1 year ago Essential hypertension   [place] [facility]   1 year ago Essential hypertension    [facility]   2 years ago Essential hypertension    [facility]       Future Appointments             In 2 days Duanne Limerick, MD [facility] at [facility], [facility]

## 2022-09-04 ENCOUNTER — Ambulatory Visit: Payer: 59 | Admitting: Family Medicine

## 2022-09-15 ENCOUNTER — Ambulatory Visit: Payer: 59 | Admitting: Family Medicine

## 2022-09-15 ENCOUNTER — Encounter: Payer: Self-pay | Admitting: Family Medicine

## 2022-09-15 VITALS — BP 120/62 | HR 60 | Ht 69.5 in | Wt 239.0 lb

## 2022-09-15 DIAGNOSIS — E782 Mixed hyperlipidemia: Secondary | ICD-10-CM | POA: Diagnosis not present

## 2022-09-15 DIAGNOSIS — J301 Allergic rhinitis due to pollen: Secondary | ICD-10-CM | POA: Diagnosis not present

## 2022-09-15 DIAGNOSIS — I10 Essential (primary) hypertension: Secondary | ICD-10-CM

## 2022-09-15 MED ORDER — HYDROCHLOROTHIAZIDE 12.5 MG PO TABS: 12.5000 mg | ORAL_TABLET | Freq: Every day | ORAL | 1 refills | Status: DC

## 2022-09-15 MED ORDER — LORATADINE 10 MG PO TABS: 10.0000 mg | ORAL_TABLET | Freq: Every day | ORAL | 1 refills | Status: AC

## 2022-09-15 MED ORDER — VALSARTAN 40 MG PO TABS: 40.0000 mg | ORAL_TABLET | Freq: Every day | ORAL | 1 refills | Status: DC

## 2022-09-15 MED ORDER — ATORVASTATIN CALCIUM 10 MG PO TABS: 10.0000 mg | ORAL_TABLET | Freq: Every day | ORAL | 1 refills | Status: DC

## 2022-09-15 NOTE — Progress Notes (Signed)
Date:  09/15/2022   Name:  Ryan Parks   DOB:  Feb 28, 1961   MRN:  [id number]   Chief Complaint: Hypertension, Hyperlipidemia, and Allergic Rhinitis   Hypertension This is a chronic problem. The current episode started more than 1 year ago. The problem has been gradually improving since onset. The problem is controlled. Pertinent negatives include no anxiety, blurred vision, chest pain, headaches, malaise/fatigue, neck pain, orthopnea, palpitations, peripheral edema, PND, shortness of breath or sweats. There are no associated agents to hypertension. There are no known risk factors for coronary artery disease. Past treatments include diuretics and angiotensin blockers. The current treatment provides moderate improvement. There are no compliance problems.  There is no history of angina, kidney disease, CAD/MI, CVA, heart failure, left ventricular hypertrophy, PVD or retinopathy. There is no history of chronic renal disease, a hypertension causing med or renovascular disease.  Hyperlipidemia This is a chronic problem. The current episode started more than 1 year ago. The problem is controlled. Recent lipid tests were reviewed and are normal. He has no history of chronic renal disease, diabetes, hypothyroidism, liver disease, obesity or nephrotic syndrome. Pertinent negatives include no chest pain, myalgias or shortness of breath. Current antihyperlipidemic treatment includes statins. The current treatment provides moderate improvement of lipids. There are no compliance problems.     Lab Results  Component Value Date   NA 142 06/19/2022   K 4.6 06/19/2022   CO2 24 06/19/2022   GLUCOSE 113 (H) 06/19/2022   BUN 11 06/19/2022   CREATININE 0.90 06/19/2022   CALCIUM 9.2 06/19/2022   EGFR 96 06/19/2022   GFRNONAA 84 01/16/2020   Lab Results  Component Value Date   CHOL 169 06/19/2022   HDL 48 06/19/2022   LDLCALC 102 (H) 06/19/2022   TRIG 107 06/19/2022   CHOLHDL 4.6 11/16/2018   No  results found for: "TSH" Lab Results  Component Value Date   HGBA1C 5.7 (H) 03/06/2022   No results found for: "WBC", "HGB", "HCT", "MCV", "PLT" Lab Results  Component Value Date   ALT 25 06/19/2022   AST 19 06/19/2022   ALKPHOS 40 (L) 06/19/2022   BILITOT 0.6 06/19/2022   No results found for: "25OHVITD2", "25OHVITD3", "VD25OH"   Review of Systems  Constitutional:  Negative for chills, fever and malaise/fatigue.  HENT:  Negative for drooling, ear discharge, ear pain and sore throat.   Eyes:  Negative for blurred vision.  Respiratory:  Negative for cough, shortness of breath and wheezing.   Cardiovascular:  Negative for chest pain, palpitations, orthopnea, leg swelling and PND.  Gastrointestinal:  Negative for abdominal pain, blood in stool, constipation, diarrhea and nausea.  Endocrine: Negative for polydipsia.  Genitourinary:  Negative for dysuria, frequency, hematuria and urgency.  Musculoskeletal:  Negative for back pain, myalgias and neck pain.  Skin:  Negative for rash.  Allergic/Immunologic: Negative for environmental allergies.  Neurological:  Negative for dizziness and headaches.  Hematological:  Does not bruise/bleed easily.  Psychiatric/Behavioral:  Negative for suicidal ideas. The patient is not nervous/anxious.     Patient Active Problem List   Diagnosis Date Noted   Preop testing    Polyp of ascending colon    Polyp of sigmoid colon    Benign neoplasm of cecum    Erectile dysfunction due to arterial insufficiency 03/13/2016   Erectile dysfunction 03/13/2016   Personal history of colonic polyps    Benign neoplasm of ascending colon    Benign neoplasm of descending colon    Benign  neoplasm of sigmoid colon    Essential hypertension 11/01/2014    No Known Allergies  Past Surgical History:  Procedure Laterality Date   COLONOSCOPY  2011   [facility]- polyps- repeat in 7   COLONOSCOPY WITH PROPOFOL N/A 05/31/2015   Procedure: COLONOSCOPY WITH PROPOFOL;   Surgeon: Midge Minium, MD;  Location: [facility];  Service: Endoscopy;  Laterality: N/A;   COLONOSCOPY WITH PROPOFOL N/A 07/01/2018   Procedure: COLONOSCOPY WITH BIOPSY;  Surgeon: Midge Minium, MD;  Location: [facility];  Service: Endoscopy;  Laterality: N/A;   COLONOSCOPY WITH PROPOFOL N/A 10/02/2021   Procedure: COLONOSCOPY WITH BIOPSY;  Surgeon: Midge Minium, MD;  Location: [facility];  Service: Endoscopy;  Laterality: N/A;   POLYPECTOMY  05/31/2015   Procedure: POLYPECTOMY;  Surgeon: Midge Minium, MD;  Location: [facility];  Service: Endoscopy;;   POLYPECTOMY N/A 07/01/2018   Procedure: POLYPECTOMY;  Surgeon: Midge Minium, MD;  Location: [facility];  Service: Endoscopy;  Laterality: N/A;   POLYPECTOMY N/A 10/02/2021   Procedure: POLYPECTOMY;  Surgeon: Midge Minium, MD;  Location: [facility];  Service: Endoscopy;  Laterality: N/A;    Social History   Tobacco Use   Smoking status: Never   Smokeless tobacco: Never  Vaping Use   Vaping Use: Never used  Substance Use Topics   Alcohol use: Yes    Alcohol/week: 0.0 standard drinks of alcohol    Comment: 1-2 drinks/month   Drug use: No     Medication list has been reviewed and updated.  Current Meds  Medication Sig   aspirin 81 MG tablet Take 1 tablet (81 mg total) by mouth daily.   atorvastatin (LIPITOR) 10 MG tablet Take 1 tablet (10 mg total) by mouth daily.   hydrochlorothiazide (HYDRODIURIL) 12.5 MG tablet TAKE 1 TABLET DAILY   ibuprofen (ADVIL) 800 MG tablet Take 1 tablet (800 mg total) by mouth every 8 (eight) hours as needed for moderate pain.   loratadine (CLARITIN) 10 MG tablet TAKE 1 TABLET DAILY   valsartan (DIOVAN) 40 MG tablet Take 1 tablet (40 mg total) by mouth daily.       09/15/2022    8:06 AM 06/19/2022    9:02 AM 03/06/2022    9:43 AM 08/21/2021    8:02 AM  GAD 7 : Generalized Anxiety Score  Nervous, Anxious, on Edge 0 0 0 0  Control/stop worrying 0 0 0 0  Worry  too much - different things 0 0 0 0  Trouble relaxing 0 0 0 0  Restless 0 0 0 0  Easily annoyed or irritable 0 0 0 0  Afraid - awful might happen 0 0 0 0  Total GAD 7 Score 0 0 0 0  Anxiety Difficulty Not difficult at all Not difficult at all Not difficult at all Not difficult at all       09/15/2022    8:06 AM 06/19/2022    9:02 AM 03/06/2022    9:43 AM  Depression screen PHQ 2/9  Decreased Interest 0 0 0  Down, Depressed, Hopeless 0 0 0  PHQ - 2 Score 0 0 0  Altered sleeping 0 0 0  Tired, decreased energy 0 0 0  Change in appetite 0 0 0  Feeling bad or failure about yourself  0 0 0  Trouble concentrating 0 0 0  Moving slowly or fidgety/restless 0 0 0  Suicidal thoughts 0 0 0  PHQ-9 Score 0 0 0  Difficult doing work/chores  Not difficult at all Not difficult at all Not difficult at all    BP Readings from Last 3 Encounters:  09/15/22 120/62  06/19/22 128/76  03/06/22 120/78    Physical Exam Vitals and nursing note reviewed.  HENT:     Head: Normocephalic.     Right Ear: Tympanic membrane, ear canal and external ear normal. There is no impacted cerumen.     Left Ear: Tympanic membrane, ear canal and external ear normal. There is no impacted cerumen.     Nose: Nose normal. No congestion or rhinorrhea.     Mouth/Throat:     Mouth: Mucous membranes are moist.  Eyes:     General: No scleral icterus.       Right eye: No discharge.        Left eye: No discharge.     Conjunctiva/sclera: Conjunctivae normal.     Pupils: Pupils are equal, round, and reactive to light.  Neck:     Thyroid: No thyromegaly.     Vascular: No carotid bruit or JVD.     Trachea: No tracheal deviation.  Cardiovascular:     Rate and Rhythm: Normal rate and regular rhythm.     Heart sounds: Normal heart sounds. No murmur heard.    No friction rub. No gallop.  Pulmonary:     Effort: No respiratory distress.     Breath sounds: Normal breath sounds. No wheezing, rhonchi or rales.  Abdominal:      General: Bowel sounds are normal.     Palpations: Abdomen is soft. There is no mass.     Tenderness: There is no abdominal tenderness. There is no guarding or rebound.  Musculoskeletal:        General: No tenderness. Normal range of motion.     Cervical back: Normal range of motion and neck supple.  Lymphadenopathy:     Cervical: No cervical adenopathy.  Skin:    General: Skin is warm.     Findings: No bruising, erythema or rash.  Neurological:     Mental Status: He is alert.     Wt Readings from Last 3 Encounters:  09/15/22 239 lb (108.4 kg)  06/19/22 238 lb (108 kg)  03/06/22 235 lb (106.6 kg)    BP 120/62   Pulse 60   Ht 5' 9.5" (1.765 m)   Wt 239 lb (108.4 kg)   SpO2 96%   BMI 34.79 kg/m   Assessment and Plan:  1. Essential hypertension Chronic.  Controlled.  Stable.  Blood pressure 120/62.  Asymptomatic.  Tolerating medications well.  Continue hydrochlorothiazide 12.5 mg once a day and valsartan 40 mg once a day.  Review of renal function and electrolytes in February/24 is acceptable - hydrochlorothiazide (HYDRODIURIL) 12.5 MG tablet; Take 1 tablet (12.5 mg total) by mouth daily.  Dispense: 90 tablet; Refill: 1 - valsartan (DIOVAN) 40 MG tablet; Take 1 tablet (40 mg total) by mouth daily.  Dispense: 90 tablet; Refill: 1  2. Mixed hyperlipidemia Chronic.  Controlled.  Stable.  Continue atorvastatin 10 mg once a day.  Review of previous lipid panel in February was acceptable - atorvastatin (LIPITOR) 10 MG tablet; Take 1 tablet (10 mg total) by mouth daily.  Dispense: 90 tablet; Refill: 1  3. Seasonal allergic rhinitis due to pollen .  Controlled.  Stable.  Continue loratadine 10 mg once a day as needed allergies. - loratadine (CLARITIN) 10 MG tablet; Take 1 tablet (10 mg total) by mouth daily.  Dispense: 90 tablet; Refill:  1    Elizabeth Sauer, MD

## 2023-03-16 ENCOUNTER — Other Ambulatory Visit: Payer: Self-pay | Admitting: Family Medicine

## 2023-03-16 DIAGNOSIS — E782 Mixed hyperlipidemia: Secondary | ICD-10-CM

## 2023-03-16 DIAGNOSIS — I10 Essential (primary) hypertension: Secondary | ICD-10-CM

## 2023-03-18 ENCOUNTER — Ambulatory Visit: Payer: 59 | Admitting: Family Medicine

## 2023-03-22 ENCOUNTER — Ambulatory Visit: Payer: 59 | Admitting: Family Medicine

## 2023-03-22 ENCOUNTER — Encounter: Payer: Self-pay | Admitting: Family Medicine

## 2023-03-22 DIAGNOSIS — M19031 Primary osteoarthritis, right wrist: Secondary | ICD-10-CM | POA: Diagnosis not present

## 2023-03-22 DIAGNOSIS — I10 Essential (primary) hypertension: Secondary | ICD-10-CM

## 2023-03-22 MED ORDER — HYDROCHLOROTHIAZIDE 12.5 MG PO TABS: 12.5000 mg | ORAL_TABLET | Freq: Every day | ORAL | 1 refills | Status: DC

## 2023-03-22 MED ORDER — IBUPROFEN 800 MG PO TABS: 800.0000 mg | ORAL_TABLET | Freq: Three times a day (TID) | ORAL | 5 refills | Status: AC | PRN

## 2023-03-22 NOTE — Progress Notes (Signed)
Date:  03/22/2023   Name:  Ryan Parks   DOB:  1960-09-13   MRN:  [id number]   Chief Complaint: Hypertension  Hypertension This is a chronic problem. The current episode started more than 1 year ago. The problem has been waxing and waning since onset. The problem is controlled. Pertinent negatives include no blurred vision, chest pain, headaches, neck pain, orthopnea, palpitations, PND, shortness of breath or sweats. There are no associated agents to hypertension. There are no known risk factors for coronary artery disease. Past treatments include diuretics. The current treatment provides moderate improvement. There are no compliance problems.  There is no history of angina, CAD/MI, CVA or PVD. There is no history of chronic renal disease, a hypertension causing med or renovascular disease.    Lab Results  Component Value Date   NA 142 06/19/2022   K 4.6 06/19/2022   CO2 24 06/19/2022   GLUCOSE 113 (H) 06/19/2022   BUN 11 06/19/2022   CREATININE 0.90 06/19/2022   CALCIUM 9.2 06/19/2022   EGFR 96 06/19/2022   GFRNONAA 84 01/16/2020   Lab Results  Component Value Date   CHOL 169 06/19/2022   HDL 48 06/19/2022   LDLCALC 102 (H) 06/19/2022   TRIG 107 06/19/2022   CHOLHDL 4.6 11/16/2018   No results found for: "TSH" Lab Results  Component Value Date   HGBA1C 5.7 (H) 03/06/2022   No results found for: "WBC", "HGB", "HCT", "MCV", "PLT" Lab Results  Component Value Date   ALT 25 06/19/2022   AST 19 06/19/2022   ALKPHOS 40 (L) 06/19/2022   BILITOT 0.6 06/19/2022   No results found for: "25OHVITD2", "25OHVITD3", "VD25OH"   Review of Systems  Constitutional:  Negative for unexpected weight change.  HENT:  Negative for congestion and trouble swallowing.   Eyes:  Negative for blurred vision and visual disturbance.  Respiratory:  Negative for cough, choking, chest tightness, shortness of breath and wheezing.   Cardiovascular:  Negative for chest pain, palpitations,  orthopnea and PND.  Gastrointestinal:  Negative for abdominal pain and blood in stool.  Musculoskeletal:  Negative for neck pain.  Neurological:  Negative for headaches.    Patient Active Problem List   Diagnosis Date Noted   Preop testing    Polyp of ascending colon    Polyp of sigmoid colon    Benign neoplasm of cecum    Erectile dysfunction due to arterial insufficiency 03/13/2016   Erectile dysfunction 03/13/2016   History of colonic polyps    Benign neoplasm of ascending colon    Benign neoplasm of descending colon    Benign neoplasm of sigmoid colon    Essential hypertension 11/01/2014    No Known Allergies  Past Surgical History:  Procedure Laterality Date   COLONOSCOPY  2011   [facility]- polyps- repeat in 7   COLONOSCOPY WITH PROPOFOL N/A 05/31/2015   Procedure: COLONOSCOPY WITH PROPOFOL;  Surgeon: Midge Minium, MD;  Location: [facility];  Service: Endoscopy;  Laterality: N/A;   COLONOSCOPY WITH PROPOFOL N/A 07/01/2018   Procedure: COLONOSCOPY WITH BIOPSY;  Surgeon: Midge Minium, MD;  Location: [facility];  Service: Endoscopy;  Laterality: N/A;   COLONOSCOPY WITH PROPOFOL N/A 10/02/2021   Procedure: COLONOSCOPY WITH BIOPSY;  Surgeon: Midge Minium, MD;  Location: [facility];  Service: Endoscopy;  Laterality: N/A;   POLYPECTOMY  05/31/2015   Procedure: POLYPECTOMY;  Surgeon: Midge Minium, MD;  Location: [facility];  Service: Endoscopy;;   POLYPECTOMY N/A 07/01/2018  Procedure: POLYPECTOMY;  Surgeon: Midge Minium, MD;  Location: [facility];  Service: Endoscopy;  Laterality: N/A;   POLYPECTOMY N/A 10/02/2021   Procedure: POLYPECTOMY;  Surgeon: Midge Minium, MD;  Location: [facility];  Service: Endoscopy;  Laterality: N/A;    Social History   Tobacco Use   Smoking status: Never   Smokeless tobacco: Never  Vaping Use   Vaping status: Never Used  Substance Use Topics   Alcohol use: Yes    Alcohol/week: 0.0 standard  drinks of alcohol    Comment: 1-2 drinks/month   Drug use: No     Medication list has been reviewed and updated.  Current Meds  Medication Sig   aspirin 81 MG tablet Take 1 tablet (81 mg total) by mouth daily.   atorvastatin (LIPITOR) 10 MG tablet TAKE 1 TABLET DAILY   hydrochlorothiazide (HYDRODIURIL) 12.5 MG tablet Take 1 tablet (12.5 mg total) by mouth daily.   ibuprofen (ADVIL) 800 MG tablet Take 1 tablet (800 mg total) by mouth every 8 (eight) hours as needed for moderate pain.   loratadine (CLARITIN) 10 MG tablet Take 1 tablet (10 mg total) by mouth daily.   sildenafil (VIAGRA) 100 MG tablet Take 1 tablet (100 mg total) by mouth daily as needed for erectile dysfunction.   traMADol (ULTRAM) 50 MG tablet Take 50 mg by mouth every 6 (six) hours as needed.   valsartan (DIOVAN) 40 MG tablet TAKE 1 TABLET DAILY       03/22/2023    8:12 AM 09/15/2022    8:06 AM 06/19/2022    9:02 AM 03/06/2022    9:43 AM  GAD 7 : Generalized Anxiety Score  Nervous, Anxious, on Edge 0 0 0 0  Control/stop worrying 0 0 0 0  Worry too much - different things 0 0 0 0  Trouble relaxing 0 0 0 0  Restless 0 0 0 0  Easily annoyed or irritable 0 0 0 0  Afraid - awful might happen 0 0 0 0  Total GAD 7 Score 0 0 0 0  Anxiety Difficulty Not difficult at all Not difficult at all Not difficult at all Not difficult at all       03/22/2023    8:12 AM 09/15/2022    8:06 AM 06/19/2022    9:02 AM  Depression screen PHQ 2/9  Decreased Interest 0 0 0  Down, Depressed, Hopeless 0 0 0  PHQ - 2 Score 0 0 0  Altered sleeping 0 0 0  Tired, decreased energy 0 0 0  Change in appetite 0 0 0  Feeling bad or failure about yourself  0 0 0  Trouble concentrating 0 0 0  Moving slowly or fidgety/restless 0 0 0  Suicidal thoughts 0 0 0  PHQ-9 Score 0 0 0  Difficult doing work/chores Not difficult at all Not difficult at all Not difficult at all    BP Readings from Last 3 Encounters:  03/22/23 120/84  09/15/22  120/62  06/19/22 128/76    Physical Exam Vitals and nursing note reviewed.  HENT:     Head: Normocephalic.     Right Ear: Tympanic membrane and external ear normal.     Left Ear: Tympanic membrane and external ear normal.     Nose: Nose normal.     Mouth/Throat:     Mouth: Mucous membranes are moist.  Eyes:     General: No scleral icterus.       Right eye: No discharge.  Left eye: No discharge.     Conjunctiva/sclera: Conjunctivae normal.     Pupils: Pupils are equal, round, and reactive to light.  Neck:     Thyroid: No thyromegaly.     Vascular: No JVD.     Trachea: No tracheal deviation.  Cardiovascular:     Rate and Rhythm: Normal rate and regular rhythm.     Heart sounds: Normal heart sounds. No murmur heard.    No friction rub. No gallop.  Pulmonary:     Effort: No respiratory distress.     Breath sounds: Normal breath sounds. No wheezing, rhonchi or rales.  Abdominal:     General: Bowel sounds are normal.     Palpations: Abdomen is soft. There is no mass.     Tenderness: There is no abdominal tenderness. There is no guarding or rebound.  Musculoskeletal:        General: No tenderness. Normal range of motion.     Cervical back: Normal range of motion and neck supple.  Lymphadenopathy:     Cervical: No cervical adenopathy.  Skin:    General: Skin is warm.     Findings: No rash.  Neurological:     Mental Status: He is alert and oriented to person, place, and time.     Cranial Nerves: No cranial nerve deficit.     Deep Tendon Reflexes: Reflexes are normal and symmetric.     Wt Readings from Last 3 Encounters:  03/22/23 235 lb (106.6 kg)  09/15/22 239 lb (108.4 kg)  06/19/22 238 lb (108 kg)    BP 120/84   Pulse 63   Ht 5' 9.5" (1.765 m)   Wt 235 lb (106.6 kg)   SpO2 97%   BMI 34.21 kg/m   Assessment and Plan: 1. Essential hypertension Chronic.  Controlled.  Stable.  Blood pressure 120/6084.  Asymptomatic.  Tolerating medication well.  Reviewed  previous labs is in acceptable range for electrolytes and GFR.  Continue hydrochlorothiazide 12.5 mg once a day.  Will recheck patient in 6 months. - hydrochlorothiazide (HYDRODIURIL) 12.5 MG tablet; Take 1 tablet (12.5 mg total) by mouth daily.  Dispense: 90 tablet; Refill: 1  2. Arthritis of right wrist Chronic.  Episodic.  Stable.  May resume ibuprofen 800 mg 3 times a day on an as-needed basis. - ibuprofen (ADVIL) 800 MG tablet; Take 1 tablet (800 mg total) by mouth every 8 (eight) hours as needed for moderate pain (pain score 4-6).  Dispense: 90 tablet; Refill: 5     Elizabeth Sauer, MD

## 2023-06-14 ENCOUNTER — Other Ambulatory Visit: Payer: Self-pay | Admitting: Family Medicine

## 2023-06-14 ENCOUNTER — Encounter: Payer: Self-pay | Admitting: Family Medicine

## 2023-06-14 DIAGNOSIS — E782 Mixed hyperlipidemia: Secondary | ICD-10-CM

## 2023-06-14 DIAGNOSIS — I10 Essential (primary) hypertension: Secondary | ICD-10-CM

## 2023-09-13 ENCOUNTER — Other Ambulatory Visit: Payer: Self-pay | Admitting: Family Medicine

## 2023-09-13 DIAGNOSIS — E782 Mixed hyperlipidemia: Secondary | ICD-10-CM

## 2023-09-13 DIAGNOSIS — I10 Essential (primary) hypertension: Secondary | ICD-10-CM

## 2023-09-21 ENCOUNTER — Ambulatory Visit: Payer: Self-pay | Admitting: Family Medicine

## 2023-09-21 ENCOUNTER — Encounter: Payer: Self-pay | Admitting: Family Medicine

## 2023-09-21 VITALS — BP 104/78 | HR 71 | Ht 69.5 in | Wt 239.0 lb

## 2023-09-21 DIAGNOSIS — E782 Mixed hyperlipidemia: Secondary | ICD-10-CM | POA: Diagnosis not present

## 2023-09-21 DIAGNOSIS — I10 Essential (primary) hypertension: Secondary | ICD-10-CM | POA: Diagnosis not present

## 2023-09-21 DIAGNOSIS — R7303 Prediabetes: Secondary | ICD-10-CM

## 2023-09-21 DIAGNOSIS — N529 Male erectile dysfunction, unspecified: Secondary | ICD-10-CM

## 2023-09-21 MED ORDER — SILDENAFIL CITRATE 100 MG PO TABS: 100.0000 mg | ORAL_TABLET | Freq: Every day | ORAL | 11 refills | Status: AC | PRN

## 2023-09-21 MED ORDER — HYDROCHLOROTHIAZIDE 12.5 MG PO TABS: 12.5000 mg | ORAL_TABLET | Freq: Every day | ORAL | 1 refills | Status: AC

## 2023-09-21 NOTE — Progress Notes (Signed)
 Date:  09/21/2023   Name:  Ryan Parks   DOB:  1960/05/20   MRN:  [id number]   Chief Complaint: Hyperlipidemia, Hypertension, and Erectile Dysfunction  Hyperlipidemia This is a chronic problem. The current episode started more than 1 year ago. The problem is controlled. Recent lipid tests were reviewed and are normal. He has no history of chronic renal disease, diabetes, hypothyroidism, liver disease, obesity or nephrotic syndrome. There are no known factors aggravating his hyperlipidemia. Pertinent negatives include no chest pain, focal sensory loss, focal weakness, leg pain, myalgias or shortness of breath. He is currently on no antihyperlipidemic treatment. The current treatment provides no improvement of lipids. There are no compliance problems.  Risk factors for coronary artery disease include dyslipidemia.  Hypertension This is a chronic problem. The current episode started more than 1 year ago. The problem has been gradually improving since onset. The problem is controlled. Pertinent negatives include no blurred vision, chest pain, headaches, malaise/fatigue, neck pain, orthopnea, palpitations, peripheral edema, PND, shortness of breath or sweats. There are no associated agents to hypertension. Risk factors for coronary artery disease include dyslipidemia. Past treatments include angiotensin blockers and diuretics. The current treatment provides moderate improvement. There is no history of CAD/MI or CVA. There is no history of chronic renal disease, a hypertension causing med or renovascular disease.  Erectile Dysfunction This is a chronic problem. The problem has been gradually improving since onset. The nature of his difficulty is achieving erection. He reports no anxiety, decreased libido or performance anxiety. Irritative symptoms do not include frequency, nocturia or urgency. Pertinent negatives include no dysuria, genital pain, hematuria or hesitancy. The treatment provided no relief.     Lab Results  Component Value Date   NA 142 06/19/2022   K 4.6 06/19/2022   CO2 24 06/19/2022   GLUCOSE 113 (H) 06/19/2022   BUN 11 06/19/2022   CREATININE 0.90 06/19/2022   CALCIUM  9.2 06/19/2022   EGFR 96 06/19/2022   GFRNONAA 84 01/16/2020   Lab Results  Component Value Date   CHOL 169 06/19/2022   HDL 48 06/19/2022   LDLCALC 102 (H) 06/19/2022   TRIG 107 06/19/2022   CHOLHDL 4.6 11/16/2018   No results found for: "TSH" Lab Results  Component Value Date   HGBA1C 5.7 (H) 03/06/2022   No results found for: "WBC", "HGB", "HCT", "MCV", "PLT" Lab Results  Component Value Date   ALT 25 06/19/2022   AST 19 06/19/2022   ALKPHOS 40 (L) 06/19/2022   BILITOT 0.6 06/19/2022   No results found for: "25OHVITD2", "25OHVITD3", "VD25OH"   Review of Systems  Constitutional:  Negative for malaise/fatigue.  Eyes:  Negative for blurred vision.  Respiratory:  Negative for chest tightness, shortness of breath and wheezing.   Cardiovascular:  Negative for chest pain, palpitations, orthopnea, leg swelling and PND.  Genitourinary:  Negative for decreased libido, difficulty urinating, dysuria, frequency, hematuria, hesitancy, nocturia and urgency.  Musculoskeletal:  Negative for myalgias and neck pain.  Neurological:  Negative for focal weakness and headaches.  Psychiatric/Behavioral:  The patient is not nervous/anxious.     Patient Active Problem List   Diagnosis Date Noted   Preop testing    Polyp of ascending colon    Polyp of sigmoid colon    Benign neoplasm of cecum    Erectile dysfunction due to arterial insufficiency 03/13/2016   Erectile dysfunction 03/13/2016   History of colonic polyps    Benign neoplasm of ascending colon    Benign  neoplasm of descending colon    Benign neoplasm of sigmoid colon    Essential hypertension 11/01/2014    No Known Allergies  Past Surgical History:  Procedure Laterality Date   COLONOSCOPY  2011   [facility]- polyps- repeat in  7   COLONOSCOPY WITH PROPOFOL  N/A 05/31/2015   Procedure: COLONOSCOPY WITH PROPOFOL ;  Surgeon: Marnee Sink, MD;  Location: [facility];  Service: Endoscopy;  Laterality: N/A;   COLONOSCOPY WITH PROPOFOL  N/A 07/01/2018   Procedure: COLONOSCOPY WITH BIOPSY;  Surgeon: Marnee Sink, MD;  Location: [facility];  Service: Endoscopy;  Laterality: N/A;   COLONOSCOPY WITH PROPOFOL  N/A 10/02/2021   Procedure: COLONOSCOPY WITH BIOPSY;  Surgeon: Marnee Sink, MD;  Location: [facility];  Service: Endoscopy;  Laterality: N/A;   POLYPECTOMY  05/31/2015   Procedure: POLYPECTOMY;  Surgeon: Marnee Sink, MD;  Location: [facility];  Service: Endoscopy;;   POLYPECTOMY N/A 07/01/2018   Procedure: POLYPECTOMY;  Surgeon: Marnee Sink, MD;  Location: [facility];  Service: Endoscopy;  Laterality: N/A;   POLYPECTOMY N/A 10/02/2021   Procedure: POLYPECTOMY;  Surgeon: Marnee Sink, MD;  Location: [facility];  Service: Endoscopy;  Laterality: N/A;    Social History   Tobacco Use   Smoking status: Never   Smokeless tobacco: Never  Vaping Use   Vaping status: Never Used  Substance Use Topics   Alcohol use: Yes    Alcohol/week: 0.0 standard drinks of alcohol    Comment: 1-2 drinks/month   Drug use: No     Medication list has been reviewed and updated.  Current Meds  Medication Sig   aspirin  81 MG tablet Take 1 tablet (81 mg total) by mouth daily.   atorvastatin  (LIPITOR) 10 MG tablet TAKE 1 TABLET DAILY   hydrochlorothiazide  (HYDRODIURIL ) 12.5 MG tablet Take 1 tablet (12.5 mg total) by mouth daily.   ibuprofen  (ADVIL ) 800 MG tablet Take 1 tablet (800 mg total) by mouth every 8 (eight) hours as needed for moderate pain (pain score 4-6).   loratadine  (CLARITIN ) 10 MG tablet Take 1 tablet (10 mg total) by mouth daily.   sildenafil  (VIAGRA ) 100 MG tablet Take 1 tablet (100 mg total) by mouth daily as needed for erectile dysfunction.   traMADol  (ULTRAM ) 50 MG tablet Take 50  mg by mouth every 6 (six) hours as needed.   valsartan  (DIOVAN ) 40 MG tablet TAKE 1 TABLET DAILY       09/21/2023    8:08 AM 03/22/2023    8:12 AM 09/15/2022    8:06 AM 06/19/2022    9:02 AM  GAD 7 : Generalized Anxiety Score  Nervous, Anxious, on Edge 0 0 0 0  Control/stop worrying 0 0 0 0  Worry too much - different things 0 0 0 0  Trouble relaxing 0 0 0 0  Restless 0 0 0 0  Easily annoyed or irritable 0 0 0 0  Afraid - awful might happen 0 0 0 0  Total GAD 7 Score 0 0 0 0  Anxiety Difficulty Not difficult at all Not difficult at all Not difficult at all Not difficult at all       03/22/2023    8:12 AM 09/15/2022    8:06 AM 06/19/2022    9:02 AM  Depression screen PHQ 2/9  Decreased Interest 0 0 0  Down, Depressed, Hopeless 0 0 0  PHQ - 2 Score 0 0 0  Altered sleeping 0 0 0  Tired, decreased energy 0  0 0  Change in appetite 0 0 0  Feeling bad or failure about yourself  0 0 0  Trouble concentrating 0 0 0  Moving slowly or fidgety/restless 0 0 0  Suicidal thoughts 0 0 0  PHQ-9 Score 0 0 0  Difficult doing work/chores Not difficult at all Not difficult at all Not difficult at all    BP Readings from Last 3 Encounters:  09/21/23 104/78  03/22/23 120/84  09/15/22 120/62    Physical Exam Vitals reviewed.  Constitutional:      Appearance: He is well-developed.  HENT:     Head: Normocephalic and atraumatic.     Right Ear: Tympanic membrane, ear canal and external ear normal.     Left Ear: Tympanic membrane, ear canal and external ear normal.     Nose: Nose normal.     Mouth/Throat:     Mouth: Mucous membranes are moist.     Dentition: Normal dentition.  Eyes:     General: Lids are normal. No scleral icterus.    Conjunctiva/sclera: Conjunctivae normal.     Pupils: Pupils are equal, round, and reactive to light.  Neck:     Thyroid: No thyromegaly.     Vascular: No carotid bruit, hepatojugular reflux or JVD.     Trachea: No tracheal deviation.  Cardiovascular:      Rate and Rhythm: Normal rate and regular rhythm.     Heart sounds: Normal heart sounds.  Pulmonary:     Effort: Pulmonary effort is normal.     Breath sounds: Normal breath sounds.  Abdominal:     General: Bowel sounds are normal.     Palpations: Abdomen is soft. There is no hepatomegaly, splenomegaly or mass.     Tenderness: There is no abdominal tenderness. There is no rebound.     Hernia: There is no hernia in the left inguinal area.  Genitourinary:    Prostate: Normal. Not enlarged, not tender and no nodules present.     Rectum: Normal. Guaiac result negative. No mass.  Musculoskeletal:        General: Normal range of motion.     Cervical back: Normal range of motion and neck supple.  Lymphadenopathy:     Cervical: No cervical adenopathy.  Skin:    General: Skin is warm and dry.     Findings: No rash.  Neurological:     Mental Status: He is alert and oriented to person, place, and time.     Sensory: No sensory deficit.     Deep Tendon Reflexes: Reflexes are normal and symmetric.  Psychiatric:        Mood and Affect: Mood is not anxious or depressed.     Wt Readings from Last 3 Encounters:  09/21/23 239 lb (108.4 kg)  03/22/23 235 lb (106.6 kg)  09/15/22 239 lb (108.4 kg)    BP 104/78   Pulse 71   Ht 5' 9.5" (1.765 m)   Wt 239 lb (108.4 kg)   SpO2 96%   BMI 34.79 kg/m   Assessment and Plan:  1. Essential hypertension (Primary) Chronic.  Controlled.  Stable.  Blood pressure 104/78.  Asymptomatic.  Tolerating current regimen well.  Given the blood pressure is low and the patient's age I am going to cut back on his medication and we will discontinue the valsartan  and continue the hydrochlorothiazide  12.5 mg once a day.  We will check CMP for electrolytes and GFR and patient will be having his blood pressure rechecked by  Dr. Alethea Andes with whom he has an appoint with the latter part of June. - hydrochlorothiazide  (HYDRODIURIL ) 12.5 MG tablet; Take 1 tablet (12.5 mg total)  by mouth daily.  Dispense: 90 tablet; Refill: 1 - Comprehensive metabolic panel with GFR  2. Erectile dysfunction, unspecified erectile dysfunction type Chronic.  Controlled.  Stable.  Patient is asymptomatic and is having good results with current dosing of sildenafil  100 mg as needed.  We will check PSA given that patient had a DRE that was normal and needed to his prostate exam for the annual reading. - sildenafil  (VIAGRA ) 100 MG tablet; Take 1 tablet (100 mg total) by mouth daily as needed for erectile dysfunction.  Dispense: 12 tablet; Refill: 11 - PSA  3. Prediabetes Chronic.  Controlled.  Stable.  Asymptomatic without polyuria polydipsia.  Will check A1c as well as CMP for electrolytes and GFR. - Comprehensive metabolic panel with GFR - Hemoglobin A1c  4. Mixed hyperlipidemia Chronic.  Controlled.  Stable.  Currently controlled with low-cholesterol low triglyceride dietary guidelines.  Will continue with current low-cholesterol low triglyceride dietary regimen.  Will recheck lipid panel for LDL management as well as CMP for hepatic concerns. - Lipid Panel With LDL/HDL Ratio - Comprehensive metabolic panel with GFR    Alayne Allis, MD

## 2023-09-21 NOTE — Patient Instructions (Signed)

## 2023-09-22 ENCOUNTER — Ambulatory Visit: Payer: Self-pay | Admitting: Family Medicine

## 2023-09-22 LAB — LIPID PANEL WITH LDL/HDL RATIO
Cholesterol, Total: 163 mg/dL (ref 100–199)
HDL: 49 mg/dL (ref >39)
LDL Chol Calc (NIH): 97 mg/dL (ref 0–99)
LDL/HDL Ratio: 2 ratio (ref 0.0–3.6)
Triglycerides: 90 mg/dL (ref 0–149)
VLDL Cholesterol Cal: 17 mg/dL (ref 5–40)

## 2023-09-22 LAB — COMPREHENSIVE METABOLIC PANEL WITH GFR
ALT: 30 IU/L (ref 0–44)
AST: 25 IU/L (ref 0–40)
Albumin: 4.6 g/dL (ref 3.9–4.9)
Alkaline Phosphatase: 42 IU/L — ABNORMAL LOW (ref 44–121)
BUN/Creatinine Ratio: 16 (ref 10–24)
BUN: 16 mg/dL (ref 8–27)
Bilirubin Total: 0.4 mg/dL (ref 0.0–1.2)
CO2: 22 mmol/L (ref 20–29)
Calcium: 9.5 mg/dL (ref 8.6–10.2)
Chloride: 104 mmol/L (ref 96–106)
Creatinine, Ser: 0.97 mg/dL (ref 0.76–1.27)
Globulin, Total: 1.9 g/dL (ref 1.5–4.5)
Glucose: 123 mg/dL — ABNORMAL HIGH (ref 70–99)
Potassium: 5.1 mmol/L (ref 3.5–5.2)
Sodium: 140 mmol/L (ref 134–144)
Total Protein: 6.5 g/dL (ref 6.0–8.5)
eGFR: 87 mL/min/{1.73_m2} (ref >59)

## 2023-09-22 LAB — PSA: Prostate Specific Ag, Serum: 1.6 ng/mL (ref 0.0–4.0)

## 2023-09-22 LAB — HEMOGLOBIN A1C
Est. average glucose Bld gHb Est-mCnc: 120 mg/dL
Hgb A1c MFr Bld: 5.8 % — ABNORMAL HIGH (ref 4.8–5.6)

## 2024-01-17 ENCOUNTER — Telehealth: Payer: Self-pay

## 2024-01-17 NOTE — Telephone Encounter (Signed)
Patient needs TOC appointment.
